# Patient Record
Sex: Male | Born: 1955 | Race: White | Hispanic: No | Smoking: Current every day smoker
Health system: Southern US, Community
[De-identification: ages and names within clinical notes are randomized; demographics above are authoritative.]

---

## 2016-09-22 ENCOUNTER — Encounter (HOSPITAL_COMMUNITY): Payer: Self-pay | Admitting: Emergency Medicine

## 2016-09-22 ENCOUNTER — Ambulatory Visit (HOSPITAL_COMMUNITY)
Admission: EM | Admit: 2016-09-22 | Discharge: 2016-09-23 | Disposition: A | Discharge status: Other Acute Inpatient Hospital | Payer: Self-pay | Attending: Orthopedic Surgery | Admitting: Orthopedic Surgery

## 2016-09-22 ENCOUNTER — Encounter (HOSPITAL_COMMUNITY)
Admission: EM | Disposition: A | Discharge status: Other Acute Inpatient Hospital | Payer: Self-pay | Source: Home / Self Care | Attending: Emergency Medicine

## 2016-09-22 ENCOUNTER — Emergency Department (HOSPITAL_COMMUNITY): Payer: Self-pay

## 2016-09-22 ENCOUNTER — Emergency Department (HOSPITAL_COMMUNITY): Payer: Self-pay | Admitting: Anesthesiology

## 2016-09-22 DIAGNOSIS — S56221A Laceration of other flexor muscle, fascia and tendon at forearm level, right arm, initial encounter: Secondary | ICD-10-CM | POA: Insufficient documentation

## 2016-09-22 DIAGNOSIS — N400 Enlarged prostate without lower urinary tract symptoms: Secondary | ICD-10-CM | POA: Insufficient documentation

## 2016-09-22 DIAGNOSIS — S41111A Laceration without foreign body of right upper arm, initial encounter: Secondary | ICD-10-CM

## 2016-09-22 DIAGNOSIS — F17210 Nicotine dependence, cigarettes, uncomplicated: Secondary | ICD-10-CM | POA: Insufficient documentation

## 2016-09-22 DIAGNOSIS — Z79899 Other long term (current) drug therapy: Secondary | ICD-10-CM | POA: Insufficient documentation

## 2016-09-22 DIAGNOSIS — I700 Atherosclerosis of aorta: Secondary | ICD-10-CM | POA: Insufficient documentation

## 2016-09-22 DIAGNOSIS — S56522A Laceration of other extensor muscle, fascia and tendon at forearm level, left arm, initial encounter: Secondary | ICD-10-CM | POA: Insufficient documentation

## 2016-09-22 DIAGNOSIS — I2510 Atherosclerotic heart disease of native coronary artery without angina pectoris: Secondary | ICD-10-CM | POA: Insufficient documentation

## 2016-09-22 DIAGNOSIS — Y92410 Unspecified street and highway as the place of occurrence of the external cause: Secondary | ICD-10-CM | POA: Insufficient documentation

## 2016-09-22 DIAGNOSIS — S51812A Laceration without foreign body of left forearm, initial encounter: Secondary | ICD-10-CM | POA: Insufficient documentation

## 2016-09-22 DIAGNOSIS — S41112A Laceration without foreign body of left upper arm, initial encounter: Secondary | ICD-10-CM

## 2016-09-22 DIAGNOSIS — S51811A Laceration without foreign body of right forearm, initial encounter: Secondary | ICD-10-CM | POA: Insufficient documentation

## 2016-09-22 DIAGNOSIS — R45851 Suicidal ideations: Secondary | ICD-10-CM

## 2016-09-22 HISTORY — PX: I & D EXTREMITY: SHX5045

## 2016-09-22 HISTORY — PX: NERVE, TENDON AND ARTERY REPAIR: SHX5695

## 2016-09-22 HISTORY — PX: FLEXOR TENDON REPAIR: SHX6501

## 2016-09-22 HISTORY — PX: REPAIR EXTENSOR TENDON: SHX5382

## 2016-09-22 LAB — I-STAT CHEM 8, ED
BUN: 14 mg/dL (ref 6–20)
CREATININE: 1 mg/dL (ref 0.61–1.24)
Calcium, Ion: 1.07 mmol/L — ABNORMAL LOW (ref 1.15–1.40)
Chloride: 106 mmol/L (ref 101–111)
GLUCOSE: 117 mg/dL — AB (ref 65–99)
HEMATOCRIT: 40 % (ref 39.0–52.0)
HEMOGLOBIN: 13.6 g/dL (ref 13.0–17.0)
POTASSIUM: 3.9 mmol/L (ref 3.5–5.1)
Sodium: 138 mmol/L (ref 135–145)
TCO2: 21 mmol/L (ref 0–100)

## 2016-09-22 LAB — CBC WITH DIFFERENTIAL/PLATELET
BASOS ABS: 0 10*3/uL (ref 0.0–0.1)
BASOS PCT: 0 %
EOS PCT: 0 %
Eosinophils Absolute: 0 10*3/uL (ref 0.0–0.7)
HEMATOCRIT: 39.2 % (ref 39.0–52.0)
HEMOGLOBIN: 13.2 g/dL (ref 13.0–17.0)
LYMPHS ABS: 1.6 10*3/uL (ref 0.7–4.0)
LYMPHS PCT: 14 %
MCH: 33.6 pg (ref 26.0–34.0)
MCHC: 33.7 g/dL (ref 30.0–36.0)
MCV: 99.7 fL (ref 78.0–100.0)
MONOS PCT: 7 %
Monocytes Absolute: 0.8 10*3/uL (ref 0.1–1.0)
NEUTROS ABS: 9 10*3/uL — AB (ref 1.7–7.7)
Neutrophils Relative %: 79 %
Platelets: 307 10*3/uL (ref 150–400)
RBC: 3.93 MIL/uL — ABNORMAL LOW (ref 4.22–5.81)
RDW: 12.6 % (ref 11.5–15.5)
WBC: 11.4 10*3/uL — ABNORMAL HIGH (ref 4.0–10.5)

## 2016-09-22 LAB — COMPREHENSIVE METABOLIC PANEL
ALBUMIN: 3.4 g/dL — AB (ref 3.5–5.0)
ALT: 20 U/L (ref 17–63)
ANION GAP: 12 (ref 5–15)
AST: 24 U/L (ref 15–41)
Alkaline Phosphatase: 75 U/L (ref 38–126)
BILIRUBIN TOTAL: 0.7 mg/dL (ref 0.3–1.2)
BUN: 13 mg/dL (ref 6–20)
CHLORIDE: 108 mmol/L (ref 101–111)
CO2: 18 mmol/L — AB (ref 22–32)
Calcium: 9 mg/dL (ref 8.9–10.3)
Creatinine, Ser: 1.16 mg/dL (ref 0.61–1.24)
GFR calc Af Amer: >60 mL/min (ref >60)
GFR calc non Af Amer: >60 mL/min (ref >60)
GLUCOSE: 121 mg/dL — AB (ref 65–99)
POTASSIUM: 3.9 mmol/L (ref 3.5–5.1)
SODIUM: 138 mmol/L (ref 135–145)
Total Protein: 6.7 g/dL (ref 6.5–8.1)

## 2016-09-22 LAB — ABO/RH: ABO/RH(D): A NEG

## 2016-09-22 LAB — ACETAMINOPHEN LEVEL

## 2016-09-22 LAB — PROTIME-INR
INR: 1.01
PROTHROMBIN TIME: 13.3 s (ref 11.4–15.2)

## 2016-09-22 LAB — TYPE AND SCREEN
ABO/RH(D): A NEG
Antibody Screen: NEGATIVE

## 2016-09-22 LAB — ETHANOL

## 2016-09-22 LAB — SALICYLATE LEVEL

## 2016-09-22 SURGERY — IRRIGATION AND DEBRIDEMENT EXTREMITY
Anesthesia: General | Site: Arm Lower

## 2016-09-22 MED ORDER — THIAMINE HCL 100 MG/ML IJ SOLN
Freq: Once | INTRAVENOUS | Status: AC
  Administered 2016-09-22: 18:00:00 via INTRAVENOUS
  Filled 2016-09-22: qty 1000

## 2016-09-22 MED ORDER — LIDOCAINE HCL (PF) 1 % IJ SOLN
INTRAMUSCULAR | Status: AC
  Filled 2016-09-22: qty 30

## 2016-09-22 MED ORDER — OXYCODONE HCL 5 MG PO TABS: 5.0000 mg | ORAL_TABLET | Freq: Once | ORAL | Status: DC | PRN

## 2016-09-22 MED ORDER — LIDOCAINE 2% (20 MG/ML) 5 ML SYRINGE
INTRAMUSCULAR | Status: AC
  Filled 2016-09-22: qty 5

## 2016-09-22 MED ORDER — VITAMIN B-1 100 MG PO TABS: 100.0000 mg | ORAL_TABLET | Freq: Every day | ORAL | Status: DC

## 2016-09-22 MED ORDER — LORAZEPAM 1 MG PO TABS: 0.0000 mg | ORAL_TABLET | Freq: Four times a day (QID) | ORAL | Status: DC

## 2016-09-22 MED ORDER — LORAZEPAM 2 MG/ML IJ SOLN
0.0000 mg | Freq: Two times a day (BID) | INTRAMUSCULAR | Status: DC
Start: 2016-09-25 — End: 2016-09-23

## 2016-09-22 MED ORDER — FENTANYL CITRATE (PF) 100 MCG/2ML IJ SOLN
25.0000 ug | INTRAMUSCULAR | Status: DC | PRN
  Administered 2016-09-23: 50 ug via INTRAVENOUS

## 2016-09-22 MED ORDER — THIAMINE HCL 100 MG/ML IJ SOLN: 100.0000 mg | Freq: Every day | INTRAMUSCULAR | Status: DC

## 2016-09-22 MED ORDER — MIDAZOLAM HCL 2 MG/2ML IJ SOLN
INTRAMUSCULAR | Status: DC | PRN
  Administered 2016-09-22: 2 mg via INTRAVENOUS

## 2016-09-22 MED ORDER — BUPIVACAINE HCL (PF) 0.25 % IJ SOLN
INTRAMUSCULAR | Status: DC | PRN
  Administered 2016-09-22: 8 mL
  Administered 2016-09-23: 10 mL

## 2016-09-22 MED ORDER — SODIUM CHLORIDE 0.9 % IR SOLN
Status: DC | PRN
  Administered 2016-09-22: 3000 mL

## 2016-09-22 MED ORDER — EPHEDRINE SULFATE 50 MG/ML IJ SOLN
INTRAMUSCULAR | Status: DC | PRN
  Administered 2016-09-22 (×2): 5 mg via INTRAVENOUS
  Administered 2016-09-22: 10 mg via INTRAVENOUS

## 2016-09-22 MED ORDER — LIDOCAINE HCL (CARDIAC) 20 MG/ML IV SOLN
INTRAVENOUS | Status: DC | PRN
  Administered 2016-09-22: 60 mg via INTRATRACHEAL

## 2016-09-22 MED ORDER — SUCCINYLCHOLINE CHLORIDE 20 MG/ML IJ SOLN
INTRAMUSCULAR | Status: DC | PRN
  Administered 2016-09-22: 80 mg via INTRAVENOUS

## 2016-09-22 MED ORDER — VITAMIN B-1 100 MG PO TABS
100.0000 mg | ORAL_TABLET | Freq: Every day | ORAL | Status: DC
  Administered 2016-09-23: 100 mg via ORAL
  Filled 2016-09-22: qty 1

## 2016-09-22 MED ORDER — FENTANYL CITRATE (PF) 250 MCG/5ML IJ SOLN
INTRAMUSCULAR | Status: AC
  Filled 2016-09-22: qty 5

## 2016-09-22 MED ORDER — PROPOFOL 10 MG/ML IV BOLUS
INTRAVENOUS | Status: DC | PRN
  Administered 2016-09-22: 200 mg via INTRAVENOUS

## 2016-09-22 MED ORDER — MIDAZOLAM HCL 2 MG/2ML IJ SOLN
INTRAMUSCULAR | Status: AC
  Filled 2016-09-22: qty 2

## 2016-09-22 MED ORDER — LORAZEPAM 1 MG PO TABS: 0.0000 mg | ORAL_TABLET | Freq: Two times a day (BID) | ORAL | Status: DC

## 2016-09-22 MED ORDER — LORAZEPAM 2 MG/ML IJ SOLN: 0.0000 mg | Freq: Four times a day (QID) | INTRAMUSCULAR | Status: DC

## 2016-09-22 MED ORDER — FENTANYL CITRATE (PF) 250 MCG/5ML IJ SOLN
INTRAMUSCULAR | Status: DC | PRN
  Administered 2016-09-22 (×3): 50 ug via INTRAVENOUS
  Administered 2016-09-22: 150 ug via INTRAVENOUS
  Administered 2016-09-22 – 2016-09-23 (×2): 50 ug via INTRAVENOUS

## 2016-09-22 MED ORDER — LACTATED RINGERS IV SOLN
INTRAVENOUS | Status: DC | PRN
  Administered 2016-09-22 – 2016-09-23 (×3): via INTRAVENOUS

## 2016-09-22 MED ORDER — ALBUMIN HUMAN 5 % IV SOLN
INTRAVENOUS | Status: DC | PRN
  Administered 2016-09-22 (×2): via INTRAVENOUS

## 2016-09-22 MED ORDER — BUPIVACAINE HCL (PF) 0.25 % IJ SOLN
INTRAMUSCULAR | Status: AC
  Filled 2016-09-22: qty 30

## 2016-09-22 MED ORDER — OXYCODONE HCL 5 MG/5ML PO SOLN: 5.0000 mg | Freq: Once | ORAL | Status: DC | PRN

## 2016-09-22 MED ORDER — 0.9 % SODIUM CHLORIDE (POUR BTL) OPTIME
TOPICAL | Status: DC | PRN
  Administered 2016-09-22: 1000 mL

## 2016-09-22 MED ORDER — ROCURONIUM BROMIDE 10 MG/ML (PF) SYRINGE
PREFILLED_SYRINGE | INTRAVENOUS | Status: AC
  Filled 2016-09-22: qty 5

## 2016-09-22 MED ORDER — PROPOFOL 10 MG/ML IV BOLUS
INTRAVENOUS | Status: AC
  Filled 2016-09-22: qty 20

## 2016-09-22 MED ORDER — SODIUM CHLORIDE 0.9 % IV BOLUS (SEPSIS)
2000.0000 mL | Freq: Once | INTRAVENOUS | Status: AC
  Administered 2016-09-22: 2000 mL via INTRAVENOUS

## 2016-09-22 MED ORDER — LORAZEPAM 2 MG/ML IJ SOLN
1.0000 mg | Freq: Once | INTRAMUSCULAR | Status: AC
  Administered 2016-09-22: 1 mg via INTRAVENOUS
  Filled 2016-09-22: qty 1

## 2016-09-22 MED ORDER — CEFAZOLIN SODIUM-DEXTROSE 2-4 GM/100ML-% IV SOLN
2.0000 g | Freq: Once | INTRAVENOUS | Status: AC
  Administered 2016-09-22: 2 g via INTRAVENOUS
  Filled 2016-09-22: qty 100

## 2016-09-22 MED ORDER — IOPAMIDOL (ISOVUE-300) INJECTION 61%
INTRAVENOUS | Status: AC
  Administered 2016-09-22: 100 mL
  Filled 2016-09-22: qty 100

## 2016-09-22 MED ORDER — SUCCINYLCHOLINE CHLORIDE 200 MG/10ML IV SOSY
PREFILLED_SYRINGE | INTRAVENOUS | Status: AC
  Filled 2016-09-22: qty 20

## 2016-09-22 MED ORDER — HEPARIN SODIUM (PORCINE) 1000 UNIT/ML IJ SOLN
INTRAMUSCULAR | Status: AC
  Filled 2016-09-22: qty 1

## 2016-09-22 MED ORDER — PHENYLEPHRINE HCL 10 MG/ML IJ SOLN
INTRAMUSCULAR | Status: DC | PRN
  Administered 2016-09-22 (×3): 80 ug via INTRAVENOUS
  Administered 2016-09-22: 120 ug via INTRAVENOUS
  Administered 2016-09-22: 40 ug via INTRAVENOUS

## 2016-09-22 SURGICAL SUPPLY — 63 items
BAG DECANTER FOR FLEXI CONT (MISCELLANEOUS) IMPLANT
BANDAGE ACE 3X5.8 VEL STRL LF (GAUZE/BANDAGES/DRESSINGS) ×8 IMPLANT
BLADE MINI RND TIP GREEN BEAV (BLADE) IMPLANT
BLADE SURG 15 STRL LF DISP TIS (BLADE) ×4 IMPLANT
BLADE SURG 15 STRL SS (BLADE) ×4
BNDG ESMARK 4X9 LF (GAUZE/BANDAGES/DRESSINGS) IMPLANT
BNDG GAUZE ELAST 4 BULKY (GAUZE/BANDAGES/DRESSINGS) ×8 IMPLANT
CANISTER SUCT 3000ML (MISCELLANEOUS) ×4 IMPLANT
CHLORAPREP W/TINT 26ML (MISCELLANEOUS) ×4 IMPLANT
CORDS BIPOLAR (ELECTRODE) ×4 IMPLANT
COVER MAYO STAND STRL (DRAPES) ×4 IMPLANT
COVER SURGICAL LIGHT HANDLE (MISCELLANEOUS) ×4 IMPLANT
CUFF TOURNIQUET SINGLE 18IN (TOURNIQUET CUFF) IMPLANT
DECANTER SPIKE VIAL GLASS SM (MISCELLANEOUS) ×4 IMPLANT
DRAPE EXTREMITY T 121X128X90 (DRAPE) ×8 IMPLANT
DRAPE SURG 17X23 STRL (DRAPES) ×4 IMPLANT
DRAPE U-SHAPE 76X120 STRL (DRAPES) ×8 IMPLANT
GAUZE SPONGE 4X4 12PLY STRL (GAUZE/BANDAGES/DRESSINGS) ×8 IMPLANT
GAUZE XEROFORM 1X8 LF (GAUZE/BANDAGES/DRESSINGS) ×4 IMPLANT
GAUZE XEROFORM 5X9 LF (GAUZE/BANDAGES/DRESSINGS) ×8 IMPLANT
GLOVE BIO SURGEON STRL SZ7.5 (GLOVE) ×4 IMPLANT
GLOVE BIOGEL PI IND STRL 8 (GLOVE) ×2 IMPLANT
GLOVE BIOGEL PI INDICATOR 8 (GLOVE) ×2
GOWN STRL REUS W/ TWL LRG LVL3 (GOWN DISPOSABLE) ×2 IMPLANT
GOWN STRL REUS W/ TWL XL LVL3 (GOWN DISPOSABLE) ×2 IMPLANT
GOWN STRL REUS W/TWL LRG LVL3 (GOWN DISPOSABLE) ×2
GOWN STRL REUS W/TWL XL LVL3 (GOWN DISPOSABLE) ×2
KIT BASIN OR (CUSTOM PROCEDURE TRAY) ×4 IMPLANT
LOOP VESSEL MAXI BLUE (MISCELLANEOUS) IMPLANT
NDL SAFETY ECLIPSE 18X1.5 (NEEDLE) IMPLANT
NEEDLE HYPO 18GX1.5 SHARP (NEEDLE)
NEEDLE HYPO 25X1 1.5 SAFETY (NEEDLE) IMPLANT
NS IRRIG 1000ML POUR BTL (IV SOLUTION) ×4 IMPLANT
PACK ORTHO EXTREMITY (CUSTOM PROCEDURE TRAY) ×4 IMPLANT
PAD CAST 3X4 CTTN HI CHSV (CAST SUPPLIES) ×2 IMPLANT
PAD CAST 4YDX4 CTTN HI CHSV (CAST SUPPLIES) IMPLANT
PADDING CAST ABS 4INX4YD NS (CAST SUPPLIES) ×2
PADDING CAST ABS COTTON 4X4 ST (CAST SUPPLIES) ×2 IMPLANT
PADDING CAST COTTON 3X4 STRL (CAST SUPPLIES) ×2
PADDING CAST COTTON 4X4 STRL (CAST SUPPLIES)
SPEAR EYE SURG WECK-CEL (MISCELLANEOUS) ×4 IMPLANT
SPLINT PLASTER CAST XFAST 3X15 (CAST SUPPLIES) IMPLANT
SPLINT PLASTER XTRA FASTSET 3X (CAST SUPPLIES)
STOCKINETTE 4X48 STRL (DRAPES) ×8 IMPLANT
SUT ETHIBOND 3-0 V-5 (SUTURE) IMPLANT
SUT ETHILON 3 0 PS 1 (SUTURE) ×44 IMPLANT
SUT ETHILON 4 0 PS 2 18 (SUTURE) ×4 IMPLANT
SUT ETHILON 9 0 BV130 4 (SUTURE) ×8 IMPLANT
SUT FIBERWIRE 4-0 18 TAPR NDL (SUTURE) ×4
SUT MERSILENE 6 0 P 1 (SUTURE) IMPLANT
SUT NYLON 9 0 VRM6 (SUTURE) IMPLANT
SUT PROLENE 6 0 P 1 18 (SUTURE) IMPLANT
SUT SILK 4 0 PS 2 (SUTURE) IMPLANT
SUT VIC AB 2-0 SH 27 (SUTURE) ×2
SUT VIC AB 2-0 SH 27XBRD (SUTURE) ×2 IMPLANT
SUT VICRYL 4-0 PS2 18IN ABS (SUTURE) IMPLANT
SUTURE FIBERWR 4-0 18 TAPR NDL (SUTURE) ×2 IMPLANT
SYR BULB 3OZ (MISCELLANEOUS) ×4 IMPLANT
SYR CONTROL 10ML LL (SYRINGE) IMPLANT
TOWEL OR 17X24 6PK STRL BLUE (TOWEL DISPOSABLE) ×8 IMPLANT
TUBE CONNECTING 12'X1/4 (SUCTIONS) ×1
TUBE CONNECTING 12X1/4 (SUCTIONS) ×3 IMPLANT
UNDERPAD 30X30 (UNDERPADS AND DIAPERS) ×4 IMPLANT
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
## 2016-09-22 NOTE — ED Notes (Signed)
Pt. signed consent form for his bilateral arms surgery by Dr. Merlyn LotKuzma.
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
## 2016-09-22 NOTE — Anesthesia Procedure Notes (Signed)
Procedure Name: Intubation Date/Time: 09/22/2016 9:56 PM Performed by: Molli HazardGORDON, Keirstin Musil M Pre-anesthesia Checklist: Patient identified, Emergency Drugs available, Suction available and Patient being monitored Patient Re-evaluated:Patient Re-evaluated prior to induction Oxygen Delivery Method: Circle system utilized Preoxygenation: Pre-oxygenation with 100% oxygen Induction Type: IV induction Laryngoscope Size: Miller and 2 Grade View: Grade I Tube type: Oral Tube size: 7.5 mm Number of attempts: 1 Airway Equipment and Method: Stylet Placement Confirmation: ETT inserted through vocal cords under direct vision,  positive ETCO2 and breath sounds checked- equal and bilateral Secured at: 24 cm Tube secured with: Tape Dental Injury: Teeth and Oropharynx as per pre-operative assessment

## 2016-09-22 NOTE — Anesthesia Preprocedure Evaluation (Addendum)
Anesthesia Evaluation  Patient identified by MRN, date of birth, ID band Patient awake    Reviewed: Allergy & Precautions, NPO status , Patient's Chart, lab work & pertinent test results  History of Anesthesia Complications Negative for: history of anesthetic complications  Airway Mallampati: I  TM Distance: >3 FB Neck ROM: Full    Dental  (+) Dental Advisory Given, Teeth Intact,    Pulmonary Current Smoker,  1.5-2 packs/day   breath sounds clear to auscultation       Cardiovascular negative cardio ROS   Rhythm:Regular     Neuro/Psych negative neurological ROS  negative psych ROS   GI/Hepatic negative GI ROS, Neg liver ROS,   Endo/Other  negative endocrine ROS  Renal/GU negative Renal ROS     Musculoskeletal   Abdominal   Peds  Hematology negative hematology ROS (+)   Anesthesia Other Findings   Reproductive/Obstetrics                            Anesthesia Physical Anesthesia Plan  ASA: II  Anesthesia Plan: General   Post-op Pain Management:    Induction: Intravenous  PONV Risk Score and Plan: 2 and Ondansetron and Dexamethasone  Airway Management Planned: Oral ETT  Additional Equipment: None  Intra-op Plan:   Post-operative Plan: Extubation in OR  Informed Consent:   Plan Discussed with: Anesthesiologist, Surgeon and CRNA  Anesthesia Plan Comments:        Anesthesia Quick Evaluation

## 2016-09-22 NOTE — ED Notes (Signed)
Pt transported to xray 

## 2016-09-23 ENCOUNTER — Inpatient Hospital Stay (HOSPITAL_COMMUNITY)
Admission: AD | Admit: 2016-09-23 | Discharge: 2016-10-05 | DRG: 881 | Disposition: A | Discharge status: Home / Self Care | Payer: Federal, State, Local not specified - Other | Attending: Psychiatry | Admitting: Psychiatry

## 2016-09-23 ENCOUNTER — Encounter (HOSPITAL_COMMUNITY): Payer: Self-pay | Admitting: Orthopedic Surgery

## 2016-09-23 DIAGNOSIS — X788XXD Intentional self-harm by other sharp object, subsequent encounter: Secondary | ICD-10-CM | POA: Diagnosis present

## 2016-09-23 DIAGNOSIS — F322 Major depressive disorder, single episode, severe without psychotic features: Secondary | ICD-10-CM | POA: Diagnosis not present

## 2016-09-23 DIAGNOSIS — F419 Anxiety disorder, unspecified: Secondary | ICD-10-CM | POA: Diagnosis present

## 2016-09-23 DIAGNOSIS — F1910 Other psychoactive substance abuse, uncomplicated: Secondary | ICD-10-CM | POA: Diagnosis not present

## 2016-09-23 DIAGNOSIS — Z560 Unemployment, unspecified: Secondary | ICD-10-CM | POA: Diagnosis not present

## 2016-09-23 DIAGNOSIS — F17210 Nicotine dependence, cigarettes, uncomplicated: Secondary | ICD-10-CM | POA: Diagnosis present

## 2016-09-23 DIAGNOSIS — S56222D Laceration of other flexor muscle, fascia and tendon at forearm level, left arm, subsequent encounter: Secondary | ICD-10-CM | POA: Diagnosis not present

## 2016-09-23 DIAGNOSIS — F329 Major depressive disorder, single episode, unspecified: Principal | ICD-10-CM | POA: Diagnosis present

## 2016-09-23 DIAGNOSIS — S56221D Laceration of other flexor muscle, fascia and tendon at forearm level, right arm, subsequent encounter: Secondary | ICD-10-CM | POA: Diagnosis not present

## 2016-09-23 DIAGNOSIS — F1010 Alcohol abuse, uncomplicated: Secondary | ICD-10-CM

## 2016-09-23 DIAGNOSIS — G4700 Insomnia, unspecified: Secondary | ICD-10-CM | POA: Diagnosis present

## 2016-09-23 DIAGNOSIS — K5900 Constipation, unspecified: Secondary | ICD-10-CM | POA: Diagnosis not present

## 2016-09-23 LAB — RAPID URINE DRUG SCREEN, HOSP PERFORMED
Amphetamines: NOT DETECTED
Barbiturates: NOT DETECTED
Benzodiazepines: POSITIVE — AB
Cocaine: NOT DETECTED
OPIATES: NOT DETECTED
Tetrahydrocannabinol: NOT DETECTED

## 2016-09-23 LAB — I-STAT CG4 LACTIC ACID, ED: Lactic Acid, Venous: 1.75 mmol/L (ref 0.5–1.9)

## 2016-09-23 MED ORDER — TRAZODONE HCL 50 MG PO TABS
50.0000 mg | ORAL_TABLET | Freq: Every evening | ORAL | Status: DC | PRN
  Administered 2016-09-26 – 2016-10-04 (×9): 50 mg via ORAL
  Filled 2016-09-23 (×2): qty 1
  Filled 2016-09-23: qty 2
  Filled 2016-09-23: qty 1
  Filled 2016-09-23: qty 7
  Filled 2016-09-23 (×3): qty 1
  Filled 2016-09-23: qty 14

## 2016-09-23 MED ORDER — SULFAMETHOXAZOLE-TRIMETHOPRIM 800-160 MG PO TABS: 1.0000 | ORAL_TABLET | Freq: Two times a day (BID) | ORAL | 0 refills | Status: DC

## 2016-09-23 MED ORDER — MAGNESIUM HYDROXIDE 400 MG/5ML PO SUSP
30.0000 mL | Freq: Every day | ORAL | Status: DC | PRN
  Administered 2016-09-25: 30 mL via ORAL
  Filled 2016-09-23: qty 30

## 2016-09-23 MED ORDER — HYDROCODONE-ACETAMINOPHEN 5-325 MG PO TABS: ORAL_TABLET | ORAL | 0 refills | Status: DC

## 2016-09-23 MED ORDER — CHLORDIAZEPOXIDE HCL 25 MG PO CAPS
25.0000 mg | ORAL_CAPSULE | Freq: Every day | ORAL | Status: AC
  Administered 2016-09-28: 25 mg via ORAL
  Filled 2016-09-23: qty 1

## 2016-09-23 MED ORDER — ADULT MULTIVITAMIN W/MINERALS CH
1.0000 | ORAL_TABLET | Freq: Every day | ORAL | Status: DC
  Administered 2016-09-24 – 2016-10-05 (×12): 1 via ORAL
  Filled 2016-09-23 (×13): qty 1

## 2016-09-23 MED ORDER — LOPERAMIDE HCL 2 MG PO CAPS: 2.0000 mg | ORAL_CAPSULE | ORAL | Status: AC | PRN

## 2016-09-23 MED ORDER — HYDROXYZINE HCL 25 MG PO TABS
25.0000 mg | ORAL_TABLET | Freq: Four times a day (QID) | ORAL | Status: AC | PRN
  Administered 2016-09-26: 25 mg via ORAL
  Filled 2016-09-23: qty 1

## 2016-09-23 MED ORDER — HYDROXYZINE HCL 25 MG PO TABS: 25.0000 mg | ORAL_TABLET | Freq: Three times a day (TID) | ORAL | Status: DC | PRN

## 2016-09-23 MED ORDER — ONDANSETRON 4 MG PO TBDP: 4.0000 mg | ORAL_TABLET | Freq: Four times a day (QID) | ORAL | Status: AC | PRN

## 2016-09-23 MED ORDER — VITAMIN B-1 100 MG PO TABS
100.0000 mg | ORAL_TABLET | Freq: Every day | ORAL | Status: DC
  Administered 2016-09-24 – 2016-10-05 (×12): 100 mg via ORAL
  Filled 2016-09-23 (×13): qty 1

## 2016-09-23 MED ORDER — CHLORDIAZEPOXIDE HCL 25 MG PO CAPS
25.0000 mg | ORAL_CAPSULE | Freq: Three times a day (TID) | ORAL | Status: AC
  Administered 2016-09-25 – 2016-09-26 (×3): 25 mg via ORAL
  Filled 2016-09-23 (×3): qty 1

## 2016-09-23 MED ORDER — ONDANSETRON HCL 4 MG/2ML IJ SOLN
INTRAMUSCULAR | Status: DC | PRN
  Administered 2016-09-23: 4 mg via INTRAVENOUS

## 2016-09-23 MED ORDER — DEXAMETHASONE SODIUM PHOSPHATE 10 MG/ML IJ SOLN
INTRAMUSCULAR | Status: DC | PRN
  Administered 2016-09-23: 10 mg via INTRAVENOUS

## 2016-09-23 MED ORDER — ACETAMINOPHEN 325 MG PO TABS
650.0000 mg | ORAL_TABLET | Freq: Four times a day (QID) | ORAL | Status: DC | PRN
  Administered 2016-09-24: 650 mg via ORAL
  Filled 2016-09-23: qty 2

## 2016-09-23 MED ORDER — CEFAZOLIN SODIUM-DEXTROSE 1-4 GM/50ML-% IV SOLN
1.0000 g | Freq: Three times a day (TID) | INTRAVENOUS | Status: DC
  Administered 2016-09-23 (×2): 1 g via INTRAVENOUS
  Filled 2016-09-23 (×2): qty 50

## 2016-09-23 MED ORDER — CHLORDIAZEPOXIDE HCL 25 MG PO CAPS
25.0000 mg | ORAL_CAPSULE | ORAL | Status: AC
  Administered 2016-09-26 – 2016-09-27 (×2): 25 mg via ORAL
  Filled 2016-09-23 (×2): qty 1

## 2016-09-23 MED ORDER — ALUM & MAG HYDROXIDE-SIMETH 200-200-20 MG/5ML PO SUSP: 30.0000 mL | ORAL | Status: DC | PRN

## 2016-09-23 MED ORDER — CHLORDIAZEPOXIDE HCL 25 MG PO CAPS
25.0000 mg | ORAL_CAPSULE | Freq: Four times a day (QID) | ORAL | Status: AC
  Administered 2016-09-23 – 2016-09-25 (×6): 25 mg via ORAL
  Filled 2016-09-23 (×6): qty 1

## 2016-09-23 MED ORDER — CHLORDIAZEPOXIDE HCL 25 MG PO CAPS: 25.0000 mg | ORAL_CAPSULE | Freq: Four times a day (QID) | ORAL | Status: AC | PRN

## 2016-09-23 MED ORDER — FENTANYL CITRATE (PF) 100 MCG/2ML IJ SOLN
INTRAMUSCULAR | Status: AC
  Filled 2016-09-23: qty 2

## 2016-09-23 MED ORDER — NICOTINE 21 MG/24HR TD PT24
21.0000 mg | MEDICATED_PATCH | Freq: Every day | TRANSDERMAL | Status: DC
  Administered 2016-09-24 – 2016-10-05 (×11): 21 mg via TRANSDERMAL
  Filled 2016-09-23: qty 1
  Filled 2016-09-23: qty 14
  Filled 2016-09-23: qty 1
  Filled 2016-09-23: qty 14
  Filled 2016-09-23: qty 1
  Filled 2016-09-23 (×3): qty 14
  Filled 2016-09-23: qty 1
  Filled 2016-09-23: qty 14
  Filled 2016-09-23 (×4): qty 1
  Filled 2016-09-23: qty 14

## 2016-09-23 NOTE — Transfer of Care (Signed)
Immediate Anesthesia Transfer of Care Note  Patient: Andrew Maldonado  Procedure(s) Performed: Procedure(s): BILATERAL ARM EXPLORATION POSSIBLE NERVE, TENDON AND ARTERY REPAIR AS NEEDED (N/A) FLEXOR TENDON REPAIR (Bilateral) REPAIR EXTENSOR TENDON (Bilateral) IRRIGATION AND DEBRIDEMENT EXTREMITY (Bilateral)  Patient Location: PACU  Anesthesia Type:General  Level of Consciousness: awake, alert  and oriented  Airway & Oxygen Therapy: Patient connected to nasal cannula oxygen  Post-op Assessment: Report given to RN and Post -op Vital signs reviewed and stable  Post vital signs: Reviewed and stable  Last Vitals:  Vitals:   09/22/16 2100 09/22/16 2115  BP: (!) 135/107 (!) 141/87  Pulse: 95 92  Resp: (!) 22 (!) 26  Temp:      Last Pain:  Vitals:   09/22/16 1725  TempSrc: Oral         Complications: No apparent anesthesia complications
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
## 2016-09-23 NOTE — Progress Notes (Signed)
Patient attend AA group meeting.  

## 2016-09-23 NOTE — Anesthesia Postprocedure Evaluation (Signed)
Anesthesia Post Note  Patient: Andrew Maldonado  Procedure(s) Performed: Procedure(s) (LRB): BILATERAL ARM EXPLORATION POSSIBLE NERVE, TENDON AND ARTERY REPAIR AS NEEDED (N/A) FLEXOR TENDON REPAIR (Bilateral) REPAIR EXTENSOR TENDON (Bilateral) IRRIGATION AND DEBRIDEMENT EXTREMITY (Bilateral)     Patient location during evaluation: PACU Anesthesia Type: General Level of consciousness: awake and patient cooperative Pain management: pain level controlled Vital Signs Assessment: post-procedure vital signs reviewed and stable Respiratory status: spontaneous breathing, nonlabored ventilation, respiratory function stable and patient connected to nasal cannula oxygen Cardiovascular status: blood pressure returned to baseline and stable Postop Assessment: no signs of nausea or vomiting Anesthetic complications: no    Last Vitals:  Vitals:   09/23/16 0145 09/23/16 0158  BP: 136/85 136/88  Pulse: 100 99  Resp: 14 20  Temp:  (!) 36.4 C    Last Pain:  Vitals:   09/23/16 0145  TempSrc:   PainSc: Asleep                 Litzi Binning

## 2016-09-23 NOTE — ED Notes (Signed)
Pt recommended for inpt and awaiting a bed.
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
## 2016-09-23 NOTE — Progress Notes (Signed)
Per Andrew Maldonado , [facility], [facility] ; Accepting provider is  Andrew ConnJason Berry, NP; Attending provider is Dr. Jama Maldonado.    Patient can arrive at 4:00pm. Number for report is [phone].   Andrew KotykMelanie Fowler, RN notified.     Andrew Maldonado MSW, LCSWA CSW Disposition [phone]

## 2016-09-23 NOTE — Op Note (Signed)
[id number] 
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
## 2016-09-23 NOTE — ED Notes (Signed)
[place] [facility] offers here [facility]. Pt has signed for his belongings, one bag. Pt denies any items being missing. Denies items being locked with security. Pt A&Ox4, ambulatory with independent steady gait, NAD

## 2016-09-23 NOTE — Progress Notes (Signed)
CSW requested that patients IVC paperwork be faxed to [phone] to begin referral process for possible placement.   Chelsea Felts, RN notified and has agreed to fax documentation.   Baldo DaubJolan Jazzie Trampe MSW, LCSWA CSW Disposition [phone]

## 2016-09-23 NOTE — Discharge Instructions (Signed)

## 2016-09-23 NOTE — ED Notes (Signed)
This RN has called [employer] [facility] to [facility] [employer].

## 2016-09-23 NOTE — ED Notes (Signed)
Consulted Dr. Patria Maneampos regarding pt's IV antibiotics dose, nurse's concerns for pt being medically cleared. Dr. Patria Maneampos agreeable to see patient, may change orders to orals. See his note.

## 2016-09-23 NOTE — Progress Notes (Signed)
DAR note: Pt in dayroom upon initial approach.  Presents with fixed smile; minimizes reason for admission.  Denies SI/HI, hallucinations, and pain.  Reports to writer that he was not drinking alcohol prior to admission, which contradicts what he reported to admission nurse.  Mild tremor observable.  On-site provider notified that pt reported he was drinking daily to admission nurse, CIWA is 3, pt is unreliable historian.  Pt placed on Librium protocol.  Medication administered per order.  He is safe on the unit; pt verbally contracts for safety.  Will continue to monitor and assess.

## 2016-09-23 NOTE — Tx Team (Signed)
Initial Treatment Plan 09/23/2016 5:29 PM FEENYat Labombard ZOX:[id number]    PATIENT STRESSORS: Financial difficulties Substance abuse   PATIENT STRENGTHS: Capable of independent living General fund of knowledge Physical Health   PATIENT IDENTIFIED PROBLEMS: Depression   Suicidal ideation/attempt  "Be a better person"                 DISCHARGE CRITERIA:  Improved stabilization in mood, thinking, and/or behavior Verbal commitment to aftercare and medication compliance Withdrawal symptoms are absent or subacute and managed without 24-hour nursing intervention  PRELIMINARY DISCHARGE PLAN: Outpatient therapy Medication management  PATIENT/FAMILY INVOLVEMENT: This treatment plan has been presented to and reviewed with the patient, BERNARDSat Stradling.  The patient and family have been given the opportunity to ask questions and make suggestions.  Levin BaconHeather V Marchello Rothgeb, RN 09/23/2016, 5:29 PM
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
## 2016-09-23 NOTE — ED Notes (Signed)
At 1208, pt had removed his IV from his right foot.

## 2016-09-24 DIAGNOSIS — S41119A Laceration without foreign body of unspecified upper arm, initial encounter: Secondary | ICD-10-CM

## 2016-09-24 DIAGNOSIS — S1191XA Laceration without foreign body of unspecified part of neck, initial encounter: Secondary | ICD-10-CM

## 2016-09-24 DIAGNOSIS — F1390 Sedative, hypnotic, or anxiolytic use, unspecified, uncomplicated: Secondary | ICD-10-CM

## 2016-09-24 DIAGNOSIS — Z560 Unemployment, unspecified: Secondary | ICD-10-CM

## 2016-09-24 DIAGNOSIS — S31119A Laceration without foreign body of abdominal wall, unspecified quadrant without penetration into peritoneal cavity, initial encounter: Secondary | ICD-10-CM

## 2016-09-24 DIAGNOSIS — Z599 Problem related to housing and economic circumstances, unspecified: Secondary | ICD-10-CM

## 2016-09-24 DIAGNOSIS — G4700 Insomnia, unspecified: Secondary | ICD-10-CM

## 2016-09-24 MED ORDER — SULFAMETHOXAZOLE-TRIMETHOPRIM 800-160 MG PO TABS: 1.0000 | ORAL_TABLET | Freq: Two times a day (BID) | ORAL | Status: DC

## 2016-09-24 MED ORDER — SULFAMETHOXAZOLE-TRIMETHOPRIM 800-160 MG PO TABS
1.0000 | ORAL_TABLET | Freq: Two times a day (BID) | ORAL | Status: AC
  Administered 2016-09-24 – 2016-09-30 (×14): 1 via ORAL
  Filled 2016-09-24 (×13): qty 1
  Filled 2016-09-24 (×3): qty 2
  Filled 2016-09-24 (×2): qty 1

## 2016-09-24 MED ORDER — IBUPROFEN 600 MG PO TABS
600.0000 mg | ORAL_TABLET | Freq: Four times a day (QID) | ORAL | Status: DC | PRN
  Administered 2016-09-24 – 2016-09-25 (×2): 600 mg via ORAL
  Filled 2016-09-24 (×2): qty 1
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
## 2016-09-24 NOTE — Progress Notes (Signed)
Patient did attend the evening speaker AA meeting.  
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
## 2016-09-24 NOTE — [facility] Group Notes (Signed)
  [facility] LCSW Group Therapy Note  09/24/2016  and  10:00 AM  Type of Therapy and Topic:  Group Therapy: Avoiding Self-Sabotaging and Enabling Behaviors  Participation Level:  Minimal  Participation Quality:  Attentive  Affect:  Flat  Cognitive:  Alert and Oriented  Insight:  None shared  Engagement in Therapy:  None noted   Therapeutic models used: Cognitive Behavioral Therapy,  Person-Centered Therapy and Motivational Interviewing  Modes of Intervention:  Discussion, Exploration, Orientation, Rapport Building, Socialization and Support   Summary of Progress/Problems:  The main focus of today's process group was for the patient to identify ways in which they have in the past sabotaged their own recovery. Motivational Interviewing was utilized to identify motivation they may have for wanting to change. The Stages of Change were explained using a handout, and patients identified where they currently are with regard to stages of change. Patient shared noting during group yet appeared attentive as evidenced by his eye contact. Patient stated he is in pre contemplation stage as "not aware of any changes I need."   Carney Bernatherine C Allecia Bells, LCSW

## 2016-09-24 NOTE — [facility] Group Notes (Addendum)
The Power of Vulnerability  Date:  09/24/2016  Time:  1300  Type of Therapy: " The  Power in [employer] " /  The group focuses on watching this  20 minute You Tube video and discussing their  Feelings in realtionship to the video.  oParticipation Level: intact   Participation Quality: fair   Affect: fiar   Cognitive:  active  Insight:limited     Engagement in Group:  Engaged   Modes of Intervention:  discussion  Summary of Progress/Problems:  Andrew Maldonado, Andrew Maldonado 09/24/2016, 4:03 PM

## 2016-09-24 NOTE — [facility] Counselor (Signed)
Unsuccessful attempt to meet with patient to complete PSA patient is in RN group. CSW to try again after 2:30 PM  Carney Bernatherine C Rance Smithson, LCSW
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
## 2016-09-25 NOTE — Progress Notes (Signed)
[facility]  09/25/2016 12:03 PM Andrew Maldonado  MRN:  [id number] Subjective:  "Complaint constipation and also reportedly received cocktail medication to relieve constipation but still did not work yet"  Objective: Patient seen by this M.D., chart reviewed, case discussed with the treatment team. Patient has been doing fine without significant behavioral or emotional difficulties, he is awake, alert, oriented to time place person and situation. Patient minimizes his symptoms of depression and anxiety and also suicidal and homicidal ideation. Patient has no auditory/visual hallucinations, delusions or paranoia. Patient reportedly have disturbed sleep but good appetite. Patient has been thinking about pulse to thoughts about his family especially kids, grandkids, sister, mom. Patient reportedly has a plans to contact his daughter today. Patient has been compliant with medication without adverse effects. Patient is actively participating in milieu therapy and group counseling sessions. Patient contracts for safety while in the hospital.  Principal Problem: <principal problem not specified> Diagnosis:   Patient Active Problem List   Diagnosis Date Noted  . MDD (major depressive disorder) [F32.9] 09/23/2016   Total Time spent with patient: 30 minutes  Past Psychiatric History: Patient has denied history of acute psychiatric hospitalization or outpatient treatment. Patient also denied alcohol and drug abuse treatment.  Past Medical History: History reviewed. No pertinent past medical history.  Past Surgical History:  Procedure Laterality Date  . FLEXOR TENDON REPAIR Bilateral 09/22/2016   Procedure: FLEXOR TENDON REPAIR;  Surgeon: Betha LoaKuzma, Kevin, MD;  [facility];  Service: Orthopedics;  Laterality: Bilateral;  . I&D EXTREMITY Bilateral 09/22/2016   Procedure: IRRIGATION AND DEBRIDEMENT EXTREMITY;  Surgeon: Betha LoaKuzma, Kevin, MD;  Location: [facility] OR;  Service: Orthopedics;  Laterality: Bilateral;  .  NERVE, TENDON AND ARTERY REPAIR N/A 09/22/2016   Procedure: BILATERAL ARM EXPLORATION POSSIBLE NERVE, TENDON AND ARTERY REPAIR AS NEEDED;  Surgeon: Betha LoaKuzma, Kevin, MD;  Location: [facility] OR;  Service: Orthopedics;  Laterality: N/A;  . REPAIR EXTENSOR TENDON Bilateral 09/22/2016   Procedure: REPAIR EXTENSOR TENDON;  Surgeon: Betha LoaKuzma, Kevin, MD;  Location: [facility] OR;  Service: Orthopedics;  Laterality: Bilateral;   Family History: History reviewed. No pertinent family history. Family Psychiatric  History: Patient denied family history of mental illness and substance abuse. Social History:  History  Alcohol Use  . 2.4 oz/week  . 4 Cans of beer per week     History  Drug Use No    Social History   Social History  . Marital status: Divorced    Spouse name: N/A  . Number of children: N/A  . Years of education: N/A   Social History Main Topics  . Smoking status: Current Every Day Smoker  . Smokeless tobacco: Never Used  . Alcohol use 2.4 oz/week    4 Cans of beer per week  . Drug use: No  . Sexual activity: Not Currently   Other Topics Concern  . None   Social History Narrative  . None   Additional Social History:                         Sleep: Poor  Appetite:  Good  Current Medications: Current Facility-Administered Medications  Medication Dose Route Frequency Provider Last Rate Last Dose  . acetaminophen (TYLENOL) tablet 650 mg  650 mg Oral Q6H PRN Okonkwo, Justina A, NP   650 mg at 09/24/16 2108  . alum & mag hydroxide-simeth (MAALOX/MYLANTA) 200-200-20 MG/5ML suspension 30 mL  30 mL Oral Q4H PRN Okonkwo, Justina A, NP      .  chlordiazePOXIDE (LIBRIUM) capsule 25 mg  25 mg Oral Q6H PRN Nira ConnBerry, Jason A, NP      . chlordiazePOXIDE (LIBRIUM) capsule 25 mg  25 mg Oral TID Jackelyn PolingBerry, Jason A, NP       Followed by  . [START ON 09/26/2016] chlordiazePOXIDE (LIBRIUM) capsule 25 mg  25 mg Oral BH-qamhs Jackelyn PolingBerry, Jason A, NP       Followed by  . [START ON 09/28/2016] chlordiazePOXIDE  (LIBRIUM) capsule 25 mg  25 mg Oral Daily Nira ConnBerry, Jason A, NP      . hydrOXYzine (ATARAX/VISTARIL) tablet 25 mg  25 mg Oral Q6H PRN Nira ConnBerry, Jason A, NP      . ibuprofen (ADVIL,MOTRIN) tablet 600 mg  600 mg Oral Q6H PRN Denzil Magnusonhomas, Lashunda, NP   600 mg at 09/24/16 1301  . loperamide (IMODIUM) capsule 2-4 mg  2-4 mg Oral PRN Nira ConnBerry, Jason A, NP      . magnesium hydroxide (MILK OF MAGNESIA) suspension 30 mL  30 mL Oral Daily PRN Okonkwo, Justina A, NP   30 mL at 09/25/16 0831  . multivitamin with minerals tablet 1 tablet  1 tablet Oral Daily Nira ConnBerry, Jason A, NP   1 tablet at 09/25/16 0829  . nicotine (NICODERM CQ - dosed in mg/24 hours) patch 21 mg  21 mg Transdermal Daily Jomarie LongsEappen, Saramma, MD   21 mg at 09/25/16 0829  . ondansetron (ZOFRAN-ODT) disintegrating tablet 4 mg  4 mg Oral Q6H PRN Nira ConnBerry, Jason A, NP      . sulfamethoxazole-trimethoprim (BACTRIM DS,SEPTRA DS) 800-160 MG per tablet 1 tablet  1 tablet Oral Q12H Leata MouseJonnalagadda, Maisyn Nouri, MD   1 tablet at 09/25/16 0829  . thiamine (VITAMIN B-1) tablet 100 mg  100 mg Oral Daily Nira ConnBerry, Jason A, NP   100 mg at 09/25/16 0829  . traZODone (DESYREL) tablet 50 mg  50 mg Oral QHS PRN Okonkwo, Justina A, NP        Lab Results: No results found for this or any previous visit (from the past 48 hour(s)).  Blood Alcohol level:  Lab Results  Component Value Date   ETH <5 09/22/2016    Metabolic Disorder Labs: No results found for: HGBA1C, MPG No results found for: PROLACTIN No results found for: CHOL, TRIG, HDL, CHOLHDL, VLDL, LDLCALC  Physical Findings: AIMS: Facial and Oral Movements Muscles of Facial Expression: None, normal Lips and Perioral Area: None, normal Jaw: None, normal Tongue: None, normal,Extremity Movements Upper (arms, wrists, hands, fingers): None, normal Lower (legs, knees, ankles, toes): None, normal, Trunk Movements Neck, shoulders, hips: None, normal, Overall Severity Severity of abnormal movements (highest score from questions  above): None, normal Incapacitation due to abnormal movements: None, normal Patient's awareness of abnormal movements (rate only patient's report): No Awareness, Dental Status Current problems with teeth and/or dentures?: No Does patient usually wear dentures?: No  CIWA:  CIWA-Ar Total: 0 COWS:     Musculoskeletal: Strength & Muscle Tone: within normal limits Gait & Station: normal Patient leans: N/A  Psychiatric Specialty Exam: Physical Exam  ROS  Blood pressure 122/89, pulse (!) 105, temperature 98.1 F (36.7 C), temperature source Oral, resp. rate 18, height 5\' 10"  (1.778 m), weight 70.3 kg (155 lb), SpO2 99 %.Body mass index is 22.24 kg/m.  General Appearance: Casual  Eye Contact:  Good  Speech:  Clear and Coherent  Volume:  Decreased  Mood:  Depressed  Affect:  Constricted and Depressed  Thought Process:  Coherent and Goal Directed  Orientation:  Full (Time,  Place, and Person)  Thought Content:  Rumination  Suicidal Thoughts:  No  Homicidal Thoughts:  No  Memory:  Immediate;   Fair Recent;   Fair Remote;   Fair  Judgement:  Impaired  Insight:  Fair  Psychomotor Activity:  Decreased  Concentration:  Concentration: Fair and Attention Span: Fair  Recall:  Good  Fund of Knowledge:  Good  Language:  Good  Akathisia:  Negative  Handed:  Right  AIMS (if indicated):     Assets:  Communication Skills Desire for Improvement Leisure Time Resilience  ADL's:  Intact  Cognition:  WNL  Sleep:  Number of Hours: 5.25     Treatment Plan Summary: Daily contact with patient to assess and evaluate symptoms and progress in treatment and Medication management   1. Will maintain Q 15 minutes observation for safety. Estimated LOS: 5-7 days 2. Patient will participate in group, milieu, and family therapy. Psychotherapy: Social and Doctor, hospital, anti-bullying, learning based strategies, cognitive behavioral, and family object relations individuation separation  intervention psychotherapies can be considered.  3. Patient has no new labs 4. We'll continue Librium detox protocol to prevent alcohol withdrawal  5. Insomnia: Continue trazodone 50 mg at bedtime  6. Status post surgery of multiple lacerated wounds on both upper extremities, but continue Bactrim DS 1 tablet twice daily has recommended, dressing as recommended.  7. Will continue to monitor patient's mood and behavior. 8. Social Work will schedule a Family meeting to obtain collateral information and discuss discharge and follow up plan.  9. Discharge concerns will also be addressed: Safety, stabilization, and access to medication  Leata Mouse, MD 09/25/2016, 12:03 PM

## 2016-09-25 NOTE — [facility] Group Notes (Signed)
Identifying Needs   Date:  09/25/2016  Time:  1300  Type of Therapy:  Nurse Education  /  The group focuses on teaching patients how to identify their needs and how to develop the skills needed to get them met.   Participation Level:  Did Not Attend  Participation Quality:    Affect:    Cognitive:    Insight:    Engagement in Group:    Modes of Intervention:    Summary of Progress/Problems:  Rex, [place] 09/25/2016, 6:50 PM

## 2016-09-25 NOTE — [facility] Group Notes (Signed)
[facility] LCSW Group Therapy  09/25/2016 10:00 AM  Type of Therapy:  Group Therapy  Participation Level:  Did Not Attend for more than 5 minutes  Summary of Progress/Problems: Topic for today was thoughts, feelings and emotions related to what may lie behind anger. Patient's had opportunity to identify with feelings such as: Jealousy, Hurt, Anxiety, Shame, Sadness, Fear, Frustration, Guilt, Disappointment, Worry, and Embarrassment. Group processed discrepancy and difficulty in admitting to underlying feelings. Pt came into day room briefly before excusing himself and appeared to not feel well.    Carney Bernatherine C Teighlor Korson, LCSW

## 2016-09-25 NOTE — Progress Notes (Signed)
D:  Pt presents with depressed affect and appropriate mood.  Goal for the day was "just to get through it."  Pt denies SI/HI, denies hallucinations, reports L arm pain of 3/10.  Pt reports he is considering going to a 14-day treatment facility after he leaves [facility], reports he is going to "sleep on it."  Pt attended evening group.  He interacts with peers and staff appropriately.  Attended evening group.  A: Met with pt and offered support and encouragement.  Medication administered per order.  PRN medication administered for pain.  Q15 minute safety checks maintained.  R: Pt is compliant with medications.  He verbally contracts for safety.  Will continue to monitor and assess.

## 2016-09-25 NOTE — Plan of Care (Signed)
Problem: Activity: Goal: Sleeping patterns will improve Outcome: Progressing Pt slept 6.25 hours last night per flowsheet.
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
## 2016-09-26 DIAGNOSIS — F1010 Alcohol abuse, uncomplicated: Secondary | ICD-10-CM

## 2016-09-26 LAB — GLUCOSE, CAPILLARY: GLUCOSE-CAPILLARY: 94 mg/dL (ref 65–99)

## 2016-09-26 MED ORDER — MAGNESIUM CITRATE PO SOLN
1.0000 | Freq: Once | ORAL | Status: AC
  Administered 2016-09-26: 1 via ORAL

## 2016-09-26 MED ORDER — SERTRALINE HCL 50 MG PO TABS
50.0000 mg | ORAL_TABLET | Freq: Every day | ORAL | Status: DC
  Administered 2016-09-26 – 2016-09-30 (×5): 50 mg via ORAL
  Filled 2016-09-26 (×3): qty 1
  Filled 2016-09-26 (×2): qty 14
  Filled 2016-09-26: qty 1
  Filled 2016-09-26: qty 14
  Filled 2016-09-26 (×2): qty 1

## 2016-09-26 NOTE — Progress Notes (Signed)
Patient ID: DEPASSat Sibert, male   DOB: [birth year], 61 y.o.   MRN: [id number]  Pt currently presents with a flat, apprehensive affect and cooperative, guarded behavior. Interaction with patient is pleasant, pt divulges much of how his day went and his consultation with the MD. Then when patient is asked about his mental state, pt refuses to answer verbally, just shakes or nods his head. Pt reports good sleep with current medication regimen. Presents with signs and symptoms of withdrawal including generalized aches and fatigue.     Pt provided with medications per providers orders. Pt's labs and vitals were monitored throughout the night. Pt given a 1:1 about emotional and mental status. Pt supported and encouraged to express concerns and questions. Pt educated on medications and suicide prevention precautions. Assessed patients bilaterall arm casts, no drainage noted, intact. Pt did report some irritation on his knuckles on his R hand. Guaze pad placed as barrier. Educated on infection prevention measures.   Pt's safety ensured with 15 minute and environmental checks. Pt currently denies SI/HI and A/V hallucinations by shaking his head no. Pt does verbally agrees to seek staff if SI/HI or A/VH occurs and to consult with staff before acting on any harmful thoughts. Will continue POC.

## 2016-09-26 NOTE — Tx Team (Signed)
Interdisciplinary Treatment and Diagnostic Plan Update  09/26/2016 Time of Session: 0830AM Andrew Maldonado MRN: [id number]  Principal Diagnosis: MDD, recurrent, severe   Secondary Diagnoses: Active Problems:   MDD (major depressive disorder)   Current Medications:  Current Facility-Administered Medications  Medication Dose Route Frequency Provider Last Rate Last Dose  . acetaminophen (TYLENOL) tablet 650 mg  650 mg Oral Q6H PRN Okonkwo, Justina A, NP   650 mg at 09/24/16 2108  . alum & mag hydroxide-simeth (MAALOX/MYLANTA) 200-200-20 MG/5ML suspension 30 mL  30 mL Oral Q4H PRN Okonkwo, Justina A, NP      . chlordiazePOXIDE (LIBRIUM) capsule 25 mg  25 mg Oral Q6H PRN Lindon Romp A, NP      . chlordiazePOXIDE (LIBRIUM) capsule 25 mg  25 mg Oral BH-qamhs Rozetta Nunnery, NP       Followed by  . [START ON 09/28/2016] chlordiazePOXIDE (LIBRIUM) capsule 25 mg  25 mg Oral Daily Lindon Romp A, NP      . hydrOXYzine (ATARAX/VISTARIL) tablet 25 mg  25 mg Oral Q6H PRN Lindon Romp A, NP      . ibuprofen (ADVIL,MOTRIN) tablet 600 mg  600 mg Oral Q6H PRN Mordecai Maes, NP   600 mg at 09/25/16 1649  . loperamide (IMODIUM) capsule 2-4 mg  2-4 mg Oral PRN Lindon Romp A, NP      . magnesium citrate solution 1 Bottle  1 Bottle Oral Once Nwoko, Agnes I, NP      . magnesium hydroxide (MILK OF MAGNESIA) suspension 30 mL  30 mL Oral Daily PRN Okonkwo, Justina A, NP   30 mL at 09/25/16 0831  . multivitamin with minerals tablet 1 tablet  1 tablet Oral Daily Lindon Romp A, NP   1 tablet at 09/26/16 0830  . nicotine (NICODERM CQ - dosed in mg/24 hours) patch 21 mg  21 mg Transdermal Daily Eappen, Saramma, MD   21 mg at 09/26/16 0830  . ondansetron (ZOFRAN-ODT) disintegrating tablet 4 mg  4 mg Oral Q6H PRN Lindon Romp A, NP      . sulfamethoxazole-trimethoprim (BACTRIM DS,SEPTRA DS) 800-160 MG per tablet 1 tablet  1 tablet Oral Q12H Ambrose Finland, MD   1 tablet at 09/26/16 0830  . thiamine (VITAMIN B-1)  tablet 100 mg  100 mg Oral Daily Lindon Romp A, NP   100 mg at 09/26/16 0830  . traZODone (DESYREL) tablet 50 mg  50 mg Oral QHS PRN Lu Duffel, Justina A, NP       PTA Medications: Prescriptions Prior to Admission  Medication Sig Dispense Refill Last Dose  . acetaminophen (TYLENOL) 325 MG tablet Take 325-650 mg by mouth every 6 (six) hours as needed (for pain or headaches).   PRN at PRN  . Camphor-Eucalyptus-Menthol (VICKS VAPORUB) 4.7-1.2-2.6 % OINT Apply 1 application topically at bedtime as needed (under the nose for congestion).   PRN at PRN  . HYDROcodone-acetaminophen (NORCO) 5-325 MG tablet 1-2 tabs po q6 hours prn pain 20 tablet 0   . ibuprofen (ADVIL,MOTRIN) 200 MG tablet Take 200-400 mg by mouth every 6 (six) hours as needed (for pain or headaches).   PRN at PRN  . Multiple Vitamins-Minerals (ONE-A-DAY MENS HEALTH FORMULA) TABS Take 1 tablet by mouth daily with supper.   09/21/2016 at pm  . Omega-3 Fatty Acids (FISH OIL PO) Take 1 capsule by mouth daily with supper.   09/21/2016 at pm  . POTASSIUM PO Take 1 tablet by mouth daily with supper.   09/21/2016 at  pm  . sulfamethoxazole-trimethoprim (BACTRIM DS) 800-160 MG tablet Take 1 tablet by mouth 2 (two) times daily. 14 tablet 0   . trolamine salicylate (ASPERCREME) 10 % cream Apply 1 application topically 2 (two) times daily as needed for muscle pain.   PRN at PRN    Patient Stressors: Financial difficulties Substance abuse  Patient Strengths: Capable of independent living General fund of knowledge Physical Health  Treatment Modalities: Medication Management, Group therapy, Case management,  1 to 1 session with clinician, Psychoeducation, Recreational therapy.   Physician Treatment Plan for Primary Diagnosis: MDD, recurrent, severe  Long Term Goal(s): Improvement in symptoms so as ready for discharge Improvement in symptoms so as ready for discharge   Short Term Goals: Ability to identify changes in lifestyle to reduce  recurrence of condition will improve Ability to verbalize feelings will improve Ability to disclose and discuss suicidal ideas Ability to demonstrate self-control will improve Ability to identify and develop effective coping behaviors will improve Ability to maintain clinical measurements within normal limits will improve Compliance with prescribed medications will improve Ability to identify triggers associated with substance abuse/mental health issues will improve  Medication Management: Evaluate patient's response, side effects, and tolerance of medication regimen.  Therapeutic Interventions: 1 to 1 sessions, Unit Group sessions and Medication administration.  Evaluation of Outcomes: Not Met  Physician Treatment Plan for Secondary Diagnosis: Active Problems:   MDD (major depressive disorder)  Long Term Goal(s): Improvement in symptoms so as ready for discharge Improvement in symptoms so as ready for discharge   Short Term Goals: Ability to identify changes in lifestyle to reduce recurrence of condition will improve Ability to verbalize feelings will improve Ability to disclose and discuss suicidal ideas Ability to demonstrate self-control will improve Ability to identify and develop effective coping behaviors will improve Ability to maintain clinical measurements within normal limits will improve Compliance with prescribed medications will improve Ability to identify triggers associated with substance abuse/mental health issues will improve     Medication Management: Evaluate patient's response, side effects, and tolerance of medication regimen.  Therapeutic Interventions: 1 to 1 sessions, Unit Group sessions and Medication administration.  Evaluation of Outcomes: Not Met   RN Treatment Plan for Primary Diagnosis: MDD, recurrent, severe  Long Term Goal(s): Knowledge of disease and therapeutic regimen to maintain health will improve  Short Term Goals: Ability to remain free  from injury will improve, Ability to verbalize feelings will improve and Ability to disclose and discuss suicidal ideas  Medication Management: RN will administer medications as ordered by provider, will assess and evaluate patient's response and provide education to patient for prescribed medication. RN will report any adverse and/or side effects to prescribing provider.  Therapeutic Interventions: 1 on 1 counseling sessions, Psychoeducation, Medication administration, Evaluate responses to treatment, Monitor vital signs and CBGs as ordered, Perform/monitor CIWA, COWS, AIMS and Fall Risk screenings as ordered, Perform wound care treatments as ordered.  Evaluation of Outcomes: Not Met   LCSW Treatment Plan for Primary Diagnosis: MDD, recurrent, severe  Long Term Goal(s): Safe transition to appropriate next level of care at discharge, Engage patient in therapeutic group addressing interpersonal concerns.  Short Term Goals: Engage patient in aftercare planning with referrals and resources, Facilitate patient progression through stages of change regarding substance use diagnoses and concerns and Identify triggers associated with mental health/substance abuse issues  Therapeutic Interventions: Assess for all discharge needs, 1 to 1 time with Social worker, Explore available resources and support systems, Assess for adequacy in community  support network, Educate family and significant other(s) on suicide prevention, Complete Psychosocial Assessment, Interpersonal group therapy.  Evaluation of Outcomes: Not Met   Progress in Treatment: Attending groups: Yes. Participating in groups: Yes. Taking medication as prescribed: Yes. Toleration medication: Yes. Family/Significant other contact made: No, will contact:  family member if patient consents Patient understands diagnosis: Yes. Discussing patient identified problems/goals with staff: Yes. Medical problems stabilized or resolved: Yes. Denies  suicidal/homicidal ideation: Yes. Issues/concerns per patient self-inventory: No. Other: n/a   New problem(s) identified: No, Describe:  n/a  New Short Term/Long Term Goal(s):  Discharge Plan or Barriers: CSW assessing--[facility] screening for admission scheduled for Thursday, 09/29/16  Reason for Continuation of Hospitalization: Anxiety Depression Medication stabilization Suicidal ideation Withdrawal symptoms  Estimated Length of Stay: Thursday morning 09/29/16. (pt has screening at [facility] that morning).   Attendees: Patient: 09/26/2016 11:23 AM  Physician: Dr. Parke Poisson MD; Dr. Dwyane Dee MD 09/26/2016 11:23 AM  Nursing: Vallery Ridge; Christa RN 09/26/2016 11:23 AM  RN Care Manager: Lars Pinks CM 09/26/2016 11:23 AM  Social Worker: Maxie Better, LCSW 09/26/2016 11:23 AM  Recreational Therapist: x 09/26/2016 11:23 AM  Other: Ricky Ala NP; Mordecai Maes NP; Darnelle Maffucci Money NP 09/26/2016 11:23 AM  Other:  09/26/2016 11:23 AM  Other: 09/26/2016 11:23 AM    Scribe for Treatment Team: [place], LCSW 09/26/2016 11:23 AM

## 2016-09-26 NOTE — Progress Notes (Signed)
Recreation Therapy Notes  Date: 09/26/2016 Time: 9:30am Location: [address] Dayroom  Group Topic: Stress Management  Goal Area(s) Addresses:  Patient will verbalize importance of using healthy stress management.  Patient will identify positive emotions associated with healthy stress management.   Behavioral Response: Engaged  Intervention: Stress Management  Activity :  Guided Body Scan. Recreation Therapy Intern introduced the stress management technique of guided body scanning. Recreation Therapy Intern played a YouTube video that allowed patients to mentally scan their body for areas of tensions. Patients were to follow along as video was played to engage in the activity.  Education: Stress Management, Discharge Planning.   Education Outcome: Acknowledges edcuation  Clinical Observations/Feedback: Pt attended group.  Arlynn Stare, Recreation Therapy Intern 

## 2016-09-26 NOTE — Progress Notes (Signed)
Patient ID: Andrew Maldonado, male   DOB: November 28, 1955, 61 y.o.   MRN: [id number]  DAR: Pt. Denies SI/HI and A/V Hallucinations. He reports sleep is good, appetite is good, energy level is normal, and concentration is good. He rates depression, hopelessness, and anxiety 1/10. Patient does not report any pain or discomfort at this time. His ace bandages on his bilateral upper extremities are dry, clean, and intact. Support and encouragement provided to the patient. Scheduled medications administered to patient per physician's orders. Patient is minimal but cooperative at this time. He is seen in the milieu intermittently. Q15 minute checks are maintained for safety.

## 2016-09-26 NOTE — Progress Notes (Signed)
[facility] MD Progress Note  09/26/2016 3:49 PM Andrew Maldonado  MRN:  [id number]   Subjective:  Patient reports that he is doing well on recovering and wants to continue his medication and treatment to be come sober. He reports drinking 3-4 beers a day for numerous years. He denies any SI/HI/AVH currently, but admits moderate depression and states it is possible that the depression pushes him to drink.   Objective: Patient is pleasant and cooperative. He is still wearing braces and ace wraps bilateral upper extremities. He shows no signs or symptoms of current withdrawal symptoms.  Principal Problem: MDD (major depressive disorder) Diagnosis:   Patient Active Problem List   Diagnosis Date Noted  . ETOH abuse [F10.10] 09/26/2016  . MDD (major depressive disorder) [F32.9] 09/23/2016   Total Time spent with patient: 25 minutes  Past Psychiatric History: See H&P  Past Medical History: History reviewed. No pertinent past medical history.  Past Surgical History:  Procedure Laterality Date  . FLEXOR TENDON REPAIR Bilateral 09/22/2016   Procedure: FLEXOR TENDON REPAIR;  Surgeon: Betha LoaKuzma, Kevin, MD;  [facility];  Service: Orthopedics;  Laterality: Bilateral;  . I&D EXTREMITY Bilateral 09/22/2016   Procedure: IRRIGATION AND DEBRIDEMENT EXTREMITY;  Surgeon: Betha LoaKuzma, Kevin, MD;  Location: [facility] OR;  Service: Orthopedics;  Laterality: Bilateral;  . NERVE, TENDON AND ARTERY REPAIR N/A 09/22/2016   Procedure: BILATERAL ARM EXPLORATION POSSIBLE NERVE, TENDON AND ARTERY REPAIR AS NEEDED;  Surgeon: Betha LoaKuzma, Kevin, MD;  Location: [facility] OR;  Service: Orthopedics;  Laterality: N/A;  . REPAIR EXTENSOR TENDON Bilateral 09/22/2016   Procedure: REPAIR EXTENSOR TENDON;  Surgeon: Betha LoaKuzma, Kevin, MD;  Location: [facility] OR;  Service: Orthopedics;  Laterality: Bilateral;   Family History: History reviewed. No pertinent family history. Family Psychiatric  History: See H&P Social History:  History  Alcohol Use  . 2.4 oz/week  . 4 Cans of  beer per week     History  Drug Use No    Social History   Social History  . Marital status: Divorced    Spouse name: N/A  . Number of children: N/A  . Years of education: N/A   Social History Main Topics  . Smoking status: Current Every Day Smoker  . Smokeless tobacco: Never Used  . Alcohol use 2.4 oz/week    4 Cans of beer per week  . Drug use: No  . Sexual activity: Not Currently   Other Topics Concern  . None   Social History Narrative  . None   Additional Social History:                         Sleep: Good  Appetite:  Good  Current Medications: Current Facility-Administered Medications  Medication Dose Route Frequency Provider Last Rate Last Dose  . acetaminophen (TYLENOL) tablet 650 mg  650 mg Oral Q6H PRN Okonkwo, Justina A, NP   650 mg at 09/24/16 2108  . alum & mag hydroxide-simeth (MAALOX/MYLANTA) 200-200-20 MG/5ML suspension 30 mL  30 mL Oral Q4H PRN Okonkwo, Justina A, NP      . chlordiazePOXIDE (LIBRIUM) capsule 25 mg  25 mg Oral Q6H PRN Nira ConnBerry, Jason A, NP      . chlordiazePOXIDE (LIBRIUM) capsule 25 mg  25 mg Oral BH-qamhs Jackelyn PolingBerry, Jason A, NP       Followed by  . [START ON 09/28/2016] chlordiazePOXIDE (LIBRIUM) capsule 25 mg  25 mg Oral Daily Nira ConnBerry, Jason A, NP      . hydrOXYzine (ATARAX/VISTARIL)  tablet 25 mg  25 mg Oral Q6H PRN Nira ConnBerry, Jason A, NP      . ibuprofen (ADVIL,MOTRIN) tablet 600 mg  600 mg Oral Q6H PRN Denzil Magnusonhomas, Lashunda, NP   600 mg at 09/25/16 1649  . loperamide (IMODIUM) capsule 2-4 mg  2-4 mg Oral PRN Nira ConnBerry, Jason A, NP      . magnesium hydroxide (MILK OF MAGNESIA) suspension 30 mL  30 mL Oral Daily PRN Okonkwo, Justina A, NP   30 mL at 09/25/16 0831  . multivitamin with minerals tablet 1 tablet  1 tablet Oral Daily Nira ConnBerry, Jason A, NP   1 tablet at 09/26/16 0830  . nicotine (NICODERM CQ - dosed in mg/24 hours) patch 21 mg  21 mg Transdermal Daily Eappen, Saramma, MD   21 mg at 09/26/16 0830  . ondansetron (ZOFRAN-ODT) disintegrating  tablet 4 mg  4 mg Oral Q6H PRN Nira ConnBerry, Jason A, NP      . sertraline (ZOLOFT) tablet 50 mg  50 mg Oral Daily Abigaile Rossie B, FNP   50 mg at 09/26/16 1524  . sulfamethoxazole-trimethoprim (BACTRIM DS,SEPTRA DS) 800-160 MG per tablet 1 tablet  1 tablet Oral Q12H Leata MouseJonnalagadda, Janardhana, MD   1 tablet at 09/26/16 0830  . thiamine (VITAMIN B-1) tablet 100 mg  100 mg Oral Daily Nira ConnBerry, Jason A, NP   100 mg at 09/26/16 0830  . traZODone (DESYREL) tablet 50 mg  50 mg Oral QHS PRN Okonkwo, Justina A, NP        Lab Results: No results found for this or any previous visit (from the past 48 hour(s)).  Blood Alcohol level:  Lab Results  Component Value Date   ETH <5 09/22/2016    Metabolic Disorder Labs: No results found for: HGBA1C, MPG No results found for: PROLACTIN No results found for: CHOL, TRIG, HDL, CHOLHDL, VLDL, LDLCALC  Physical Findings: AIMS: Facial and Oral Movements Muscles of Facial Expression: None, normal Lips and Perioral Area: None, normal Jaw: None, normal Tongue: None, normal,Extremity Movements Upper (arms, wrists, hands, fingers): None, normal Lower (legs, knees, ankles, toes): None, normal, Trunk Movements Neck, shoulders, hips: None, normal, Overall Severity Severity of abnormal movements (highest score from questions above): None, normal Incapacitation due to abnormal movements: None, normal Patient's awareness of abnormal movements (rate only patient's report): No Awareness, Dental Status Current problems with teeth and/or dentures?: No Does patient usually wear dentures?: No  CIWA:  CIWA-Ar Total: 0 COWS:     Musculoskeletal: Strength & Muscle Tone: within normal limits Gait & Station: normal Patient leans: N/A  Psychiatric Specialty Exam: Physical Exam  Nursing note and vitals reviewed. Constitutional: He is oriented to person, place, and time. He appears well-developed and well-nourished.  Musculoskeletal: Normal range of motion.  Neurological: He is  alert and oriented to person, place, and time.  Skin: Skin is warm.    Review of Systems  Constitutional: Negative.   HENT: Negative.   Eyes: Negative.   Respiratory: Negative.   Cardiovascular: Negative.   Gastrointestinal: Negative.   Genitourinary: Negative.   Musculoskeletal: Negative.   Skin: Negative.   Neurological: Negative.   Endo/Heme/Allergies: Negative.     Blood pressure 123/84, pulse (!) 104, temperature 97.8 F (36.6 C), temperature source Oral, resp. rate 18, height 5\' 10"  (1.778 m), weight 70.3 kg (155 lb), SpO2 99 %.Body mass index is 22.24 kg/m.  General Appearance: Casual  Eye Contact:  Good  Speech:  Clear and Coherent and Normal Rate  Volume:  Normal  Mood:  Euthymic  Affect:  Flat  Thought Process:  Coherent and Descriptions of Associations: Intact  Orientation:  Full (Time, Place, and Person)  Thought Content:  WDL  Suicidal Thoughts:  No  Homicidal Thoughts:  No  Memory:  Immediate;   Good Recent;   Good  Judgement:  Good  Insight:  Good  Psychomotor Activity:  Normal  Concentration:  Concentration: Good and Attention Span: Good  Recall:  Good  Fund of Knowledge:  Good  Language:  Good  Akathisia:  No  Handed:  Right  AIMS (if indicated):     Assets:  Housing Social Support  ADL's:  Intact  Cognition:  WNL  Sleep:  Number of Hours: 5.75     Treatment Plan Summary: Daily contact with patient to assess and evaluate symptoms and progress in treatment, Medication management and Plan is to:  -Start Zoloft 50 mg PO Daily for depression -Continue CIWA protocol for ETOH detox -Encourage group therapy participation -[facility] screening on 09-28-16  Maryfrances Bunnell, FNP 09/26/2016, 3:49 PM

## 2016-09-26 NOTE — Progress Notes (Signed)
D: Pt was in the dayroom upon initial approach.  Pt presents with appropriate affect and mood.  Describes his day as "okay" and reports goal is "just to be better."  Pt denies SI/HI, denies hallucinations, denies pain.  Pt has been visible in milieu interacting with peers and staff appropriately.  Pt attended evening group.    A: Introduced self to pt.  Actively listened to pt and offered support and encouragement. Medication administered per order.  Q15 minute safety checks maintained.  R: Pt is safe on the unit.  Pt is compliant with medication.  Pt verbally contracts for safety.  Will continue to monitor and assess.

## 2016-09-26 NOTE — [facility] Suicide Risk Assessment (Signed)
[facility] INPATIENT:  Family/Significant Other Suicide Prevention Education  Suicide Prevention Education:  Patient Refusal for Family/Significant Other Suicide Prevention Education: The patient ESCOTOat [place] has refused to provide written consent for family/significant other to be provided Family/Significant Other Suicide Prevention Education during admission and/or prior to discharge.  Physician notified.  Shuree Brossart L Arianni Gallego MSW, LCSW  09/26/2016, 11:04 AM

## 2016-09-27 DIAGNOSIS — F1010 Alcohol abuse, uncomplicated: Secondary | ICD-10-CM

## 2016-09-27 DIAGNOSIS — F322 Major depressive disorder, single episode, severe without psychotic features: Secondary | ICD-10-CM

## 2016-09-27 DIAGNOSIS — F17210 Nicotine dependence, cigarettes, uncomplicated: Secondary | ICD-10-CM

## 2016-09-27 NOTE — Progress Notes (Signed)
Patient ID: PAR[place]at [place], male   DOB: 01-Oct-1955, 61 y.o.   MRN: [id number]  Pt currently presents with a flat affect and depressed behavior. Pt reports to writer that he accomplished his goal today by "not having a drink or smoke." Pt reports he is waiting to "hear back from a rehab on Thursday." Pt states "It would be good to have someone I can talk to in person every day." Pt reports good sleep with current medication regimen.   Pt provided with medications per providers orders. Pt's labs and vitals were monitored throughout the night. Pt given a 1:1 about emotional and mental status. Pt supported and encouraged to express concerns and questions. Pt educated on medications. Pt casts assessed, no drainage noted, good capillary refill.   Pt's safety ensured with 15 minute and environmental checks. Pt currently denies SI/HI and A/V hallucinations. Pt verbally agrees to seek staff if SI/HI or A/VH occurs and to consult with staff before acting on any harmful thoughts. Will continue POC.

## 2016-09-27 NOTE — Progress Notes (Signed)
Patient denies SI, HI and AVH this shift.  Patietn reports a decrease in depression and anxiety.  Patient has been actively engaged in groups activities and compliant with therapy.  Patient states he hopes to be able to discharge soon.   Assess patient for safety, offer medications as prescribed, engage patient in 1:1 staff talks.   Continue to monitor as prescribed. PAtietn able to contract for safety.
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
## 2016-09-28 ENCOUNTER — Encounter (HOSPITAL_COMMUNITY): Payer: Self-pay | Admitting: Behavioral Health

## 2016-09-28 DIAGNOSIS — F1910 Other psychoactive substance abuse, uncomplicated: Secondary | ICD-10-CM

## 2016-09-28 MED ORDER — NICOTINE 21 MG/24HR TD PT24: 21.0000 mg | MEDICATED_PATCH | Freq: Every day | TRANSDERMAL | 0 refills | Status: AC

## 2016-09-28 MED ORDER — SERTRALINE HCL 50 MG PO TABS: 50.0000 mg | ORAL_TABLET | Freq: Every day | ORAL | 0 refills | Status: DC

## 2016-09-28 MED ORDER — HYDROXYZINE HCL 25 MG PO TABS: 25.0000 mg | ORAL_TABLET | Freq: Four times a day (QID) | ORAL | Status: DC | PRN

## 2016-09-28 MED ORDER — TRAZODONE HCL 50 MG PO TABS: 50.0000 mg | ORAL_TABLET | Freq: Every evening | ORAL | 0 refills | Status: AC | PRN

## 2016-09-28 MED ORDER — HYDROXYZINE HCL 25 MG PO TABS
25.0000 mg | ORAL_TABLET | Freq: Four times a day (QID) | ORAL | Status: DC | PRN
  Administered 2016-09-28 – 2016-10-02 (×4): 25 mg via ORAL
  Filled 2016-09-28 (×4): qty 1

## 2016-09-28 MED ORDER — SULFAMETHOXAZOLE-TRIMETHOPRIM 800-160 MG PO TABS: 1.0000 | ORAL_TABLET | Freq: Two times a day (BID) | ORAL | Status: DC

## 2016-09-28 NOTE — Progress Notes (Signed)
Pt attended NA meeting this evening.  

## 2016-09-28 NOTE — Tx Team (Signed)
Interdisciplinary Treatment and Diagnostic Plan Update  09/28/2016 Time of Session: 0830AM Andrew Maldonado MRN: [id number]  Principal Diagnosis: MDD, recurrent, severe   Secondary Diagnoses: Principal Problem:   MDD (major depressive disorder) Active Problems:   ETOH abuse   Current Medications:  Current Facility-Administered Medications  Medication Dose Route Frequency Provider Last Rate Last Dose  . acetaminophen (TYLENOL) tablet 650 mg  650 mg Oral Q6H PRN Okonkwo, Justina A, NP   650 mg at 09/24/16 2108  . alum & mag hydroxide-simeth (MAALOX/MYLANTA) 200-200-20 MG/5ML suspension 30 mL  30 mL Oral Q4H PRN Okonkwo, Justina A, NP      . ibuprofen (ADVIL,MOTRIN) tablet 600 mg  600 mg Oral Q6H PRN Denzil Magnusonhomas, Lashunda, NP   600 mg at 09/25/16 1649  . magnesium hydroxide (MILK OF MAGNESIA) suspension 30 mL  30 mL Oral Daily PRN Okonkwo, Justina A, NP   30 mL at 09/25/16 0831  . multivitamin with minerals tablet 1 tablet  1 tablet Oral Daily Nira ConnBerry, Jason A, NP   1 tablet at 09/28/16 0823  . nicotine (NICODERM CQ - dosed in mg/24 hours) patch 21 mg  21 mg Transdermal Daily Jomarie LongsEappen, Saramma, MD   21 mg at 09/28/16 0823  . sertraline (ZOLOFT) tablet 50 mg  50 mg Oral Daily Money, LUCARELLIravis B, FNP   50 mg at 09/28/16 [id number]  . sulfamethoxazole-trimethoprim (BACTRIM DS,SEPTRA DS) 800-160 MG per tablet 1 tablet  1 tablet Oral Q12H Leata MouseJonnalagadda, Janardhana, MD   1 tablet at 09/28/16 0823  . thiamine (VITAMIN B-1) tablet 100 mg  100 mg Oral Daily Nira ConnBerry, Jason A, NP   100 mg at 09/28/16 [id number]  . traZODone (DESYREL) tablet 50 mg  50 mg Oral QHS PRN Beryle Lathekonkwo, Justina A, NP   50 mg at 09/27/16 2206   PTA Medications: Prescriptions Prior to Admission  Medication Sig Dispense Refill Last Dose  . acetaminophen (TYLENOL) 325 MG tablet Take 325-650 mg by mouth every 6 (six) hours as needed (for pain or headaches).   PRN at PRN  . Camphor-Eucalyptus-Menthol (VICKS VAPORUB) 4.7-1.2-2.6 % OINT Apply 1 application topically at  bedtime as needed (under the nose for congestion).   PRN at PRN  . HYDROcodone-acetaminophen (NORCO) 5-325 MG tablet 1-2 tabs po q6 hours prn pain 20 tablet 0   . ibuprofen (ADVIL,MOTRIN) 200 MG tablet Take 200-400 mg by mouth every 6 (six) hours as needed (for pain or headaches).   PRN at PRN  . Multiple Vitamins-Minerals (ONE-A-DAY MENS HEALTH FORMULA) TABS Take 1 tablet by mouth daily with supper.   09/21/2016 at pm  . Omega-3 Fatty Acids (FISH OIL PO) Take 1 capsule by mouth daily with supper.   09/21/2016 at pm  . POTASSIUM PO Take 1 tablet by mouth daily with supper.   09/21/2016 at pm  . sulfamethoxazole-trimethoprim (BACTRIM DS) 800-160 MG tablet Take 1 tablet by mouth 2 (two) times daily. 14 tablet 0   . trolamine salicylate (ASPERCREME) 10 % cream Apply 1 application topically 2 (two) times daily as needed for muscle pain.   PRN at PRN    Patient Stressors: Financial difficulties Substance abuse  Patient Strengths: Capable of independent living General fund of knowledge Physical Health  Treatment Modalities: Medication Management, Group therapy, Case management,  1 to 1 session with clinician, Psychoeducation, Recreational therapy.   Physician Treatment Plan for Primary Diagnosis: MDD, recurrent, severe  Long Term Goal(s): Improvement in symptoms so as ready for discharge Improvement in symptoms so as ready for  discharge   Short Term Goals: Ability to identify changes in lifestyle to reduce recurrence of condition will improve Ability to verbalize feelings will improve Ability to disclose and discuss suicidal ideas Ability to demonstrate self-control will improve Ability to identify and develop effective coping behaviors will improve Ability to maintain clinical measurements within normal limits will improve Compliance with prescribed medications will improve Ability to identify triggers associated with substance abuse/mental health issues will improve  Medication Management:  Evaluate patient's response, side effects, and tolerance of medication regimen.  Therapeutic Interventions: 1 to 1 sessions, Unit Group sessions and Medication administration.  Evaluation of Outcomes: Adequate for discharge   Physician Treatment Plan for Secondary Diagnosis: Principal Problem:   MDD (major depressive disorder) Active Problems:   ETOH abuse  Long Term Goal(s): Improvement in symptoms so as ready for discharge Improvement in symptoms so as ready for discharge   Short Term Goals: Ability to identify changes in lifestyle to reduce recurrence of condition will improve Ability to verbalize feelings will improve Ability to disclose and discuss suicidal ideas Ability to demonstrate self-control will improve Ability to identify and develop effective coping behaviors will improve Ability to maintain clinical measurements within normal limits will improve Compliance with prescribed medications will improve Ability to identify triggers associated with substance abuse/mental health issues will improve     Medication Management: Evaluate patient's response, side effects, and tolerance of medication regimen.  Therapeutic Interventions: 1 to 1 sessions, Unit Group sessions and Medication administration.  Evaluation of Outcomes: Adequate for discharge    RN Treatment Plan for Primary Diagnosis: MDD, recurrent, severe  Long Term Goal(s): Knowledge of disease and therapeutic regimen to maintain health will improve  Short Term Goals: Ability to remain free from injury will improve, Ability to verbalize feelings will improve and Ability to disclose and discuss suicidal ideas  Medication Management: RN will administer medications as ordered by provider, will assess and evaluate patient's response and provide education to patient for prescribed medication. RN will report any adverse and/or side effects to prescribing provider.  Therapeutic Interventions: 1 on 1 counseling sessions,  Psychoeducation, Medication administration, Evaluate responses to treatment, Monitor vital signs and CBGs as ordered, Perform/monitor CIWA, COWS, AIMS and Fall Risk screenings as ordered, Perform wound care treatments as ordered.  Evaluation of Outcomes: Adequate for discharge    LCSW Treatment Plan for Primary Diagnosis: MDD, recurrent, severe  Long Term Goal(s): Safe transition to appropriate next level of care at discharge, Engage patient in therapeutic group addressing interpersonal concerns.  Short Term Goals: Engage patient in aftercare planning with referrals and resources, Facilitate patient progression through stages of change regarding substance use diagnoses and concerns and Identify triggers associated with mental health/substance abuse issues  Therapeutic Interventions: Assess for all discharge needs, 1 to 1 time with Social worker, Explore available resources and support systems, Assess for adequacy in community support network, Educate family and significant other(s) on suicide prevention, Complete Psychosocial Assessment, Interpersonal group therapy.  Evaluation of Outcomes: Adequate for discharge    Progress in Treatment: Attending groups: Yes. Participating in groups: Yes. Taking medication as prescribed: Yes. Toleration medication: Yes. Family/Significant other contact made: SPE completed with pt; pt declined to consent to family contact.  Patient understands diagnosis: Yes. Discussing patient identified problems/goals with staff: Yes. Medical problems stabilized or resolved: Yes. Denies suicidal/homicidal ideation: Yes. Issues/concerns per patient self-inventory: No. Other: n/a   New problem(s) identified: No, Describe:  n/a  New Short Term/Long Term Goal(s): elimination of SI thoughts;  medication management for detox/mood stabilization; development of comprehensive mental wellness/sobriety plan.   Discharge Plan or Barriers: [facility] screening for  admission scheduled for Thursday, 09/29/16; [facility] for outpatient mental health services. [facility] pamphlet for additional community support/resources.   Reason for Continuation of Hospitalization: medication management  Estimated Length of Stay: Thursday morning 09/29/16. ([facility]).   Attendees: Patient: 09/28/2016 11:03 AM  Physician: Dr. Jama Flavorsobos MD; Dr. Jackquline BerlinIzediuno MD 09/28/2016 11:03 AM  Nursing: Armando ReichertMarian RN; Christa RN 09/28/2016 11:03 AM  RN Care Manager: Onnie BoerJennifer Clark CM 09/28/2016 11:03 AM  Social Worker: Chartered loss adjusterHeather Smart, LCSW 09/28/2016 11:03 AM  Recreational Therapist: x 09/28/2016 11:03 AM  Other: Hillery Jacksanika Lewis NP; Denzil MagnusonLashunda Thomas NP; VAILEravis Money NP 09/28/2016 11:03 AM  Other:  09/28/2016 11:03 AM  Other: 09/28/2016 11:03 AM    Scribe for Treatment Team: Ledell PeoplesHeather N Smart, LCSW 09/28/2016 11:03 AM

## 2016-09-28 NOTE — Progress Notes (Signed)
Adult Psychoeducational Group Note  Date:  09/28/2016 Time:  1:22 AM  Group Topic/Focus:  Wrap-Up Group:   The focus of this group is to help patients review their daily goal of treatment and discuss progress on daily workbooks.  Participation Level:  Active  Participation Quality:  Appropriate  Affect:  Appropriate  Cognitive:  Appropriate  Insight: Appropriate  Engagement in Group:  Engaged  Modes of Intervention:  Discussion  Additional Comments:  Pt stated his goal for today was to get plenty of rest. Pt stated he accomplished his goal today. Pt rated his overall day a 6 out of 10. Pt stated he meet a new peer which help him improve his day.  UTZChristopher  Narmeen Kerper 09/28/2016, 1:22 AM

## 2016-09-28 NOTE — Progress Notes (Signed)
[facility] Note  09/28/2016 12:48 PM FIUMARAat Hammes  MRN:  [id number]  Subjective: " I am doing pretty good. WILMESFell like things have improved. We talked about me going to long term rehab after I leave here and I hadn't heard anything else about it."    Evaluation on the unit: Face to face evaluation completed, case discussed during treatment team, chart reviewed.  Andrew Maldonado is a 61 yo Caucasian male, divorced. Background history of AUD and mood disorder. Presented to the ER via EMS. Patient was picked up from his motel room. He was initially voluntary and later committed. He was stated to have lacerated his forearms with a razor. He was stated to have left a suicide note for his daughter. UDS was positive for Benzodizepines.  During this evaluation, patient is alert and oriented x4, calm and cooperative. He seems to have good insight regarding his current mental health stats and treatment. He presents initially flat and depressed however he brightens on approach. He endorses improvement in depressive symptoms, anxiety and hopelessness rating all as 2/10 with 0 being none and 10 being the worse. He is scheduled to be [facility]     and he [facility]. He denies SI, HI or AVH at this time. Reports medications are well tolerated and without side effects. Endorses good appetite and sleeping pattern. At this time, he is able to contract and maintain safety on the unit.       Principal Problem: MDD (major depressive disorder) Diagnosis:   Patient Active Problem List   Diagnosis Date Noted  . ETOH abuse [F10.10] 09/26/2016  . MDD (major depressive disorder) [F32.9] 09/23/2016   Total Time spent with patient: 20 minutes  Past Psychiatric History: As in H&P  Past Medical History: History reviewed. No pertinent past medical history.  Past Surgical History:  Procedure Laterality Date  . FLEXOR TENDON REPAIR Bilateral 09/22/2016   Procedure: FLEXOR TENDON  REPAIR;  Surgeon: Betha LoaKuzma, Kevin, MD;  [facility];  Service: Orthopedics;  Laterality: Bilateral;  . I&D EXTREMITY Bilateral 09/22/2016   Procedure: IRRIGATION AND DEBRIDEMENT EXTREMITY;  Surgeon: Betha LoaKuzma, Kevin, MD;  Location: [facility] OR;  Service: Orthopedics;  Laterality: Bilateral;  . NERVE, TENDON AND ARTERY REPAIR N/A 09/22/2016   Procedure: BILATERAL ARM EXPLORATION POSSIBLE NERVE, TENDON AND ARTERY REPAIR AS NEEDED;  Surgeon: Betha LoaKuzma, Kevin, MD;  Location: [facility] OR;  Service: Orthopedics;  Laterality: N/A;  . REPAIR EXTENSOR TENDON Bilateral 09/22/2016   Procedure: REPAIR EXTENSOR TENDON;  Surgeon: Betha LoaKuzma, Kevin, MD;  Location: [facility] OR;  Service: Orthopedics;  Laterality: Bilateral;   Family History: History reviewed. No pertinent family history. Family Psychiatric  History: As in H&P  Social History:  History  Alcohol Use  . 2.4 oz/week  . 4 Cans of beer per week     History  Drug Use No    Social History   Social History  . Marital status: Divorced    Spouse name: N/A  . Number of children: N/A  . Years of education: N/A   Social History Main Topics  . Smoking status: Current Every Day Smoker  . Smokeless tobacco: Never Used  . Alcohol use 2.4 oz/week    4 Cans of beer per week  . Drug use: No  . Sexual activity: Not Currently   Other Topics Concern  . None   Social History Narrative  . None   Additional Social History:        Sleep:  Good  Appetite:  Good  Current Medications: Current Facility-Administered Medications  Medication Dose Route Frequency Provider Last Rate Last Dose  . acetaminophen (TYLENOL) tablet 650 mg  650 mg Oral Q6H PRN Okonkwo, Justina A, NP   650 mg at 09/24/16 2108  . alum & mag hydroxide-simeth (MAALOX/MYLANTA) 200-200-20 MG/5ML suspension 30 mL  30 mL Oral Q4H PRN Okonkwo, Justina A, NP      . ibuprofen (ADVIL,MOTRIN) tablet 600 mg  600 mg Oral Q6H PRN Denzil Magnuson, NP   600 mg at 09/25/16 1649  . magnesium hydroxide (MILK OF MAGNESIA)  suspension 30 mL  30 mL Oral Daily PRN Okonkwo, Justina A, NP   30 mL at 09/25/16 0831  . multivitamin with minerals tablet 1 tablet  1 tablet Oral Daily Nira Conn A, NP   1 tablet at 09/28/16 0823  . nicotine (NICODERM CQ - dosed in mg/24 hours) patch 21 mg  21 mg Transdermal Daily Jomarie Longs, MD   21 mg at 09/28/16 0823  . sertraline (ZOLOFT) tablet 50 mg  50 mg Oral Daily Money, Gerlene Burdock, FNP   50 mg at 09/28/16 1610  . sulfamethoxazole-trimethoprim (BACTRIM DS,SEPTRA DS) 800-160 MG per tablet 1 tablet  1 tablet Oral Q12H Leata Mouse, MD   1 tablet at 09/28/16 0823  . thiamine (VITAMIN B-1) tablet 100 mg  100 mg Oral Daily Nira Conn A, NP   100 mg at 09/28/16 9604  . traZODone (DESYREL) tablet 50 mg  50 mg Oral QHS PRN Beryle Lathe, Justina A, NP   50 mg at 09/27/16 2206    Lab Results:  Results for orders placed or performed during the hospital encounter of 09/23/16 (from the past 48 hour(s))  Glucose, capillary     Status: None   Collection Time: 09/26/16  5:14 PM  Result Value Ref Range   Glucose-Capillary 94 65 - 99 mg/dL   Comment 1 Notify RN    Comment 2 Document in Chart     Blood Alcohol level:  Lab Results  Component Value Date   ETH <5 09/22/2016    Metabolic Disorder Labs: No results found for: HGBA1C, MPG No results found for: PROLACTIN No results found for: CHOL, TRIG, HDL, CHOLHDL, VLDL, LDLCALC  Physical Findings: AIMS: Facial and Oral Movements Muscles of Facial Expression: None, normal Lips and Perioral Area: None, normal Jaw: None, normal Tongue: None, normal,Extremity Movements Upper (arms, wrists, hands, fingers): None, normal Lower (legs, knees, ankles, toes): None, normal, Trunk Movements Neck, shoulders, hips: None, normal, Overall Severity Severity of abnormal movements (highest score from questions above): None, normal Incapacitation due to abnormal movements: None, normal Patient's awareness of abnormal movements (rate only  patient's report): No Awareness, Dental Status Current problems with teeth and/or dentures?: No Does patient usually wear dentures?: No  CIWA:  CIWA-Ar Total: 0 COWS:     Musculoskeletal: Strength & Muscle Tone: within normal limits Gait & Station: broad based Patient leans: N/A  Psychiatric Specialty Exam: Physical Exam  Nursing note and vitals reviewed. Constitutional: He is oriented to person, place, and time. No distress.  HENT:  Head: Atraumatic.  Eyes: Pupils are equal, round, and reactive to light.  Respiratory: Effort normal.  Musculoskeletal: Normal range of motion.  Neurological: He is alert and oriented to person, place, and time.  Skin: Skin is warm and dry. He is not diaphoretic.  Psychiatric:  As above    Review of Systems  Psychiatric/Behavioral: Positive for depression and substance abuse. Negative for hallucinations,  memory loss and suicidal ideas. The patient is nervous/anxious. The patient does not have insomnia.   All other systems reviewed and are negative.   Blood pressure 104/65, pulse (!) 108, temperature 98.4 F (36.9 C), temperature source Oral, resp. rate 16, height 5\' 10"  (1.778 m), weight 155 lb (70.3 kg), SpO2 99 %.Body mass index is 22.24 kg/m.  General Appearance:  Flushed, in hospital clothing. Both forearms are dressed. Good rapport. Appropriate behavior.   Eye Contact:  Good  Speech:  Clear and Coherent and Normal Rate  Volume:  Normal  Mood:  Euthymic  Affect:  Appropriate and Full Range  Thought Process:  Linear  Orientation:  Full (Time, Place, and Person)  Thought Content:  No delusional theme. No preoccupation with violent thoughts. No negative ruminations. No obsession.  No hallucination in any modality.   Suicidal Thoughts:  No  Homicidal Thoughts:  No  Memory:  Immediate;   Good Recent;   Good Remote;   Good  Judgement:  Good  Insight:  Fair  Psychomotor Activity:  Normal  Concentration:  Concentration: Good and Attention  Span: Good  Recall:  Good  Fund of Knowledge:  Good  Language:  Good  Akathisia:  Negative  Handed:    AIMS (if indicated):     Assets:  Communication Skills Desire for Improvement  ADL's:  Impaired  Cognition:  WNL  Sleep:  Number of Hours: 6.25     Treatment Plan Summary: Patient is detoxing smoothly. He denies depression as well as SI or homicidal ideas. There are no signs of psychosis or other  psychotic process.  He minimizes his use of alcohol. He is willing to get into rehab. [facility] 09[facility]. Will continue the following treatment plan without adjustments;   Psychiatric: AUD MDD  Medical:  Psychosocial:   PLAN: 1. Continue current medication regimen; Librium protocol for detox, Nicoderm 21 mg TD for smoking cessation, Zoloft 50 mg po daily for depression, Bactrim 1 tablet q12 hours for prevention of wound care, Trazodone 50 mg po daily at bedtime for insomnia.   2. Continue to monitor mood, behavior and interaction with peers 3. SW would coordinate aftercare   Denzil MagnusonLaShunda France Lusty, NP 09/28/2016, 12:48 PM   Patient ID: Andrew GrahamPat Maldonado, male   DOB: [birth year], 61 y.o.   MRN: [id number]

## 2016-09-28 NOTE — Plan of Care (Signed)
Problem: Coping: Goal: Ability to demonstrate self-control will improve Outcome: Completed/Met Date Met: 09/28/16 Patient is calm and appropriate.
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
## 2016-09-28 NOTE — Progress Notes (Signed)
D: Patient observed up and visible in the milieu. Frequent contacts made 1:1 throughout shift. Patient verbalizes to this Clinical research associatewriter he is nervous about discharge plan now that [facility] not an option due to his need for post op follow up on his arms. Patient's affect anxious with congruent mood. Patient pleasant and cooperative. Per self inventory and discussions with writer, rates depression at a 2/10, hopelessness at a 2/10 and anxiety at a 2/10. Rates sleep as good, appetite as good, energy as normal  and concentration as good.  States goal for today is to "learn and listen." Denies pain, physical problems.   A: Medicated per orders, prn vistaril given this evening for anxiety. Level III obs in place for safety. Emotional support offered and self inventory reviewed. Encouraged completion of Suicide Safety Plan and programming participation. Discussed POC with MD, SW.   R: Patient verbalizes understanding of POC. On reassess, patient's anxiety decreased. Patient denies SI/HI/AVH and remains safe on level III obs. Will continue to monitor closely and make verbal contact frequently. Patient made aware team will re-eval his disposition in the AM.

## 2016-09-28 NOTE — Progress Notes (Signed)
Nursing Progress Note: 7p-7a D: Pt currently presents with a depressed/anxious/worried affect and behavior. Pt states "I had a bad day. I really wish these casts were not so limiting. I got bad news from home too but I Don't wanna talk about it." Interacting appropriately with milieu. Pt reports good sleep during the previous night with current medication regimen.   A: Pt provided with medications per providers orders. Pt's labs and vitals were monitored throughout the night. Pt supported emotionally and encouraged to express concerns and questions. Pt educated on medications.  R: Pt's safety ensured with 15 minute and environmental checks. Pt currently denies SI, HI, and AVH. Pt verbally contracts to seek staff if SI,HI, or AVH occurs and to consult with staff before acting on any harmful thoughts. Will continue to monitor.

## 2016-09-28 NOTE — Progress Notes (Addendum)
  [facility]  Will you be returning to the same living situation after discharge:  No. Pt has [facility] screening for admission on Thursday at 7:45AM.  At discharge, do you have transportation home?: Yes,  taxi voucher in chart-PATIENT MUST DISCHARGE NO LATER THAN 7AM ON THURSDAY FOR [facility] SCREENING Do you have the ability to pay for your medications: Yes,  mental health  Release of information consent forms completed and submitted to medical records by CSW.  Patient to Follow up at: Follow-up Information    NOT ELIGIBLE FOR [facility] DUE TO NEED FOR SURGERY ON ARM.         [facility] Follow up.   Specialty:  Behavioral Health Why:  Walk in within 7 days of hospital/rehab discharge to be assessed for outpatient mental health services including: medication management and counseling. Walk in hours: Mon-Fri 8am-9am. Thank you.  Contact information: [address] [place] [address] [phone]           Next level of care provider has [facility]:no  Safety Planning and Suicide Prevention discussed: Yes,  SPE completed with pt; pt declined to consent to family contact. SPI pamphlet and Mobile Crisis information provided to pt.   Have you used any form of tobacco in the last 30 days? (Cigarettes, Smokeless Tobacco, Cigars, and/or Pipes): Yes  Has patient been referred to the Quitline?: Patient refused referral  Patient has been referred for addiction treatment: Yes  Pulte HomesHeather N Smart, LCSW 09/28/2016, 11:02 AM

## 2016-09-28 NOTE — Progress Notes (Signed)
Recreation Therapy Notes  Date: 09/28/2016 Time: 9:30am Location: [address] Dayroom  Group Topic: Stress Management  Goal Area(s) Addresses:  Patient will verbalize importance of using healthy stress management.  Patient will identify positive emotions associated with healthy stress management.   Behavioral Response: Engaged  Intervention: Stress Management  Activity : Visual Relaxation. Recreation Therapy Intern introduced the stress management technique of visual relaxation. Recreation Therapy Intern read a script that allowed patients to take mental trip to the beach. Recreation Therapy Intern played calming beach wave sounds. Patients were to follow along as script was read to engage in the activity.  Education: Stress Management, Discharge Planning.   Education Outcome: Acknowledges edcuation  Clinical Observations/Feedback: Pt attended group.  Rachel Meyer, Recreation Therapy Intern  Nylan Nakatani, LRT/CTRS 

## 2016-09-29 NOTE — [facility] Group Notes (Signed)
[facility] Group Notes:  (Nursing/MHT/Case Management/Adjunct)  Date:  09/29/2016  Time:  0930  Type of Therapy:  Nurse Education  Participation Level:  Did Not Attend  Participation Quality:    Affect:    Cognitive:    Insight:    Engagement in Group:    Modes of Intervention:    Summary of Progress/Problems: Patient invited however elected not to attend group.  STRICKFriedman, Serena Petterson [facility] 09/29/2016, 1000

## 2016-09-29 NOTE — Care Management Note (Signed)
Surgical follow up appointment scheduled with orthopedic surgeon, Dr. Kevin Kuzma, on Wednesday 10/05/16 at 1:45pm. See discharge instructions.  

## 2016-09-29 NOTE — [facility] Group Notes (Deleted)
[facility] Group Notes:  (Nursing/MHT/Case Management/Adjunct)  Date:  09/29/2016  Time:  0930  Type of Therapy:  Nurse Education  Participation Level:  Did Not Attend  Participation Quality:    Affect:    Cognitive:    Insight:    Engagement in Group:    Modes of Intervention:    Summary of Progress/Problems: Patient invited to group however elected to remain in bed.  THEURERFriedman, Bless Belshe Eakes 09/29/2016, 5:48 PM
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
## 2016-09-29 NOTE — Care Management Note (Signed)
Surgical follow up appointment scheduled with orthopedic surgeon, Dr. Betha LoaKevin Kuzma, on Wednesday 10/05/16 at 1:45pm. See discharge instructions.

## 2016-09-29 NOTE — [facility] Group Notes (Signed)
[facility] LCSW Group Therapy Note  Date/Time: 09/29/2016 at 1:15pm  Type of Therapy/Topic:  Group Therapy:  Balance in Life  Participation Level: Active  Description of Group:    This group will address the concept of balance and how it feels and looks when one is unbalanced. Patients will be encouraged to process areas in their lives that are out of balance, and identify reasons for remaining unbalanced. Facilitators will guide patients utilizing problem- solving interventions to address and correct the stressor making their life unbalanced. Understanding and applying boundaries will be explored and addressed for obtaining  and maintaining a balanced life. Patients will be encouraged to explore ways to assertively make their unbalanced needs known to significant others in their lives, using other group members and facilitator for support and feedback.  Therapeutic Goals: 1. Patient will identify two or more emotions or situations they have that consume much of in their lives. 2. Patient will identify signs/triggers that life has become out of balance:  3. Patient will identify two ways to set boundaries in order to achieve balance in their lives:  4. Patient will demonstrate ability to communicate their needs through discussion and/or role plays  Summary of Patient Progress: Patient actively participated in group on today. Patient was able to identify areas he felt were out of balance and identify reasons why. Patient was also able to identify ways he could set boundaries in order to achieve a more balanced life. Andrew Maldonado reported he would like to get himself together so that he can visit his grandkids.  Patient interacted positively with his peers and was receptive to feedback provided by CSW.    Therapeutic Modalities:   Cognitive Behavioral Therapy Solution-Focused Therapy Assertiveness Training   Andrew Maldonado, [place] Clinical Social Worker [facility] Ph: [phone]

## 2016-09-29 NOTE — Progress Notes (Deleted)
  [facility]  Will you be returning to the same living situation after discharge:  No. Pt has [facility] screening for admission on Thursday at 7:45AM.  At discharge, do you have transportation home?: Yes,  taxi voucher in chart-PATIENT MUST DISCHARGE NO LATER THAN 7AM ON THURSDAY FOR [facility] SCREENING Do you have the ability to pay for your medications: Yes,  mental health  Release of information consent forms completed and submitted to medical records by CSW.  Patient to Follow up at: Follow-up Information    NOT ELIGIBLE FOR [facility] DUE TO NEED FOR SURGERY ON ARM.         [facility] up.   Specialty:  Behavioral Health Why:  Walk in within 7 days of hospital/rehab discharge to be assessed for outpatient mental health services including: medication management and counseling. Walk in hours: Mon-Fri 8am-9am. Thank you.  Contact information: [address] [place] [address] [phone]           Next level of care provider has access to [facility] Link:no  Safety Planning and Suicide Prevention discussed: Yes,  SPE completed with pt; pt declined to consent to family contact. SPI pamphlet and Mobile Crisis information provided to pt.   Have you used any form of tobacco in the last 30 days? (Cigarettes, Smokeless Tobacco, Cigars, and/or Pipes): Yes  Has patient been referred to the Quitline?: Patient refused referral  Patient has been referred for addiction treatment: Yes  SEVERTnne C Jesua Tamblyn, LCSW 09/29/2016, 8:56 AM

## 2016-09-29 NOTE — Progress Notes (Signed)
PT NOTE:  [facility]  Pt is mobilizing IND without assistive device and demonstrating good stability with no LOB.  Pt reports no difficulty with mobility since admission and reports he has been performing basic ADLs including showering unassisted.  Pt screened only with no PT needs identified at this time.  PT services to be dc at this time.
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
## 2016-09-29 NOTE — Progress Notes (Addendum)
Spoke with OT Gaylyn LambertMary Ellen (pager [phone]) who states surgeon's post op note does not identify whether patient has any restrictions on his activity. Plan cannot be developed at this time without direction from surgeon. Izediuno, MD updated who states he will attempt to make contact with Dr. Merlyn LotKuzma in the AM. Disposition uncertain at present. Patient remains anxious and distressed regarding current status.

## 2016-09-29 NOTE — Progress Notes (Deleted)
  [facility] Discharge [facility] :  Will you be returning to the same living situation after discharge:  No. Pt has daymark screening for admission on Thursday at 7:45AM.  At discharge, do you have transportation home?: Yes,  taxi voucher in chart-PATIENT MUST DISCHARGE NO LATER THAN 7AM ON THURSDAY FOR [facility] SCREENING Do you have the ability to pay for your medications: Yes,  mental health  Release of information consent forms completed and submitted to medical records by CSW.  Patient to Follow up at: Follow-up Information    NOT ELIGIBLE FOR [facility] DUE TO NEED FOR SURGERY ON ARM.         [facility].   Specialty:  Behavioral Health Why:  Walk in within 7 days of hospital/rehab discharge to be assessed for outpatient mental health services including: medication management and counseling. Walk in hours: Mon-Fri 8am-9am. Thank you.  Contact information: [address] [place] [address] [phone]           Next level of care provider has access to [facility] Link:no  Safety Planning and Suicide Prevention discussed: Yes,  SPE completed with pt; pt declined to consent to family contact. SPI pamphlet and Mobile Crisis information provided to pt.   Have you used any form of tobacco in the last 30 days? (Cigarettes, Smokeless Tobacco, Cigars, and/or Pipes): Yes  Has patient been referred to the Quitline?: Patient refused referral  Patient has been referred for addiction treatment: Yes  Andrew C Kenzy Campoverde, LCSW 09/29/2016, 8:57 AM

## 2016-09-29 NOTE — Plan of Care (Signed)
Problem: Education: Goal: Verbalization of understanding the information provided will improve Outcome: Progressing Patient verbalizes understanding of information, education provided.  Problem: Medication: Goal: Compliance with prescribed medication regimen will improve Outcome: Completed/Met Date Met: 09/29/16 Patient remains compliant with medications. Expresses need for prns when appropriate.   

## 2016-09-29 NOTE — Progress Notes (Signed)
Nursing Progress Note: 7p-7a D: Pt currently presents with a flat/sad/anxious affect and behavior. Pt states "I had a fifty fifty day. It was good and bad. Just concerned about the uncertainty of what is going to happen to me. I wish I could just leave, but again where would I go?." Interacting appropriately with milieu. Pt reports good sleep during the previous night with current medication regimen.   A: Pt provided with medications per providers orders. Pt's labs and vitals were monitored throughout the night. Pt supported emotionally and encouraged to express concerns and questions. Pt educated on medications.  R: Pt's safety ensured with 15 minute and environmental checks. Pt currently denies SI, HI, and AVH. Pt verbally contracts to seek staff if SI,HI, or AVH occurs and to consult with staff before acting on any harmful thoughts. Will continue to monitor.

## 2016-09-29 NOTE — Social Work (Signed)
RN CM Crystal Jon BillingsMorrison and Onnie BoerJennifer Clark notified that patient needs surgical follow up after discharge.  Santa GeneraAnne Beckham Buxbaum, LCSW Lead Clinical Social Worker Phone:  [phone]

## 2016-09-29 NOTE — Progress Notes (Signed)
D: Patient observed resting in bed much of day however patient does attend meals and did attend afternoon SW group. Frequent contacts made 1:1 throughout shift. Patient verbalizes to this Clinical research associatewriter he is anxious about his discharge plan. "Where will I go? How will I get to my appointments?" Patient's affect flat, anxious and worried with congruent mood. Per self inventory and discussions with writer, rates depression, hopelessness and anxiety all at a 6/10. Rates sleep as fair, appetite as fair, energy as normal and concentration as poor.  States goal for today is to "learn, listen". Denies pain, physical problems.   A: Medicated per orders, no prns requested, required. Level III obs in place for safety. Emotional support offered and self inventory reviewed. Encouraged completion of Suicide Safety Plan and programming participation. Discussed POC with MD, SW.   R: Patient verbalizes understanding of POC. Patient denies SI/HI/AVH and remains safe on level III obs. Will continue to monitor closely and make verbal contact frequently.

## 2016-09-30 MED ORDER — SERTRALINE HCL 100 MG PO TABS
100.0000 mg | ORAL_TABLET | Freq: Every day | ORAL | Status: DC
  Administered 2016-10-01 – 2016-10-05 (×5): 100 mg via ORAL
  Filled 2016-09-30 (×7): qty 1

## 2016-09-30 MED ORDER — TUBERCULIN PPD 5 UNIT/0.1ML ID SOLN
5.0000 [IU] | Freq: Once | INTRADERMAL | Status: AC
  Administered 2016-09-30: 5 [IU] via INTRADERMAL

## 2016-09-30 NOTE — Social Work (Signed)
PASARR submitted.  Patient packet referred to following family care homes, awaiting response.  Patient has no payor source for placement.  If accepted will need assistance monthly fees for placement. Faxed out to the follow facilities: [facility] - administrator will come [facility] to assess patient on Monday from 12 - 1 Dor[facility] Mango([facility]) [facility]  Awaiting responses from above facilities.  Santa GeneraAnne Cunningham, LCSW Lead Clinical Social Worker Phone:  [phone]

## 2016-09-30 NOTE — [place] FL2 (Signed)
[place] MEDICAID FL2 LEVEL OF CARE SCREENING TOOL     IDENTIFICATION  Patient Name: Andrew Maldonado Birthdate: [birth year] Sex: male Admission Date (Current Location): 09/23/2016  [facility] [place]   Facility and Address:   [facility], [address], [place]  [address])      Provider Number: [id number]  Attending Physician Name and Address:  Craige Cottaobos, Fernando A, MD  Relative Name and Phone Number:       Current Level of Care: Hospital Recommended Level of [facility]: [facility] Prior Approval Number:    Date Approved/Denied:   PASRR Number: pending  Discharge Plan: Domiciliary (Rest home)    Current Diagnoses: Patient Active Problem List   Diagnosis Date Noted  . ETOH abuse 09/26/2016  . MDD (major depressive disorder) 09/23/2016    Orientation RESPIRATION BLADDER Height & Weight     Time, Self, Situation, Place  Normal Continent Weight: 155 lb (70.3 kg) Height:  5\' 10"  (177.8 cm)  BEHAVIORAL SYMPTOMS/MOOD NEUROLOGICAL BOWEL NUTRITION STATUS      Continent Diet (Regular diet)  AMBULATORY STATUS COMMUNICATION OF NEEDS Skin   Independent Verbally Normal                       Personal Care Assistance Level of Assistance  Bathing, Feeding, Dressing Bathing Assistance: Independent Feeding assistance: Independent Dressing Assistance: Independent     Functional Limitations Info  Sight, Hearing, Speech Sight Info: Adequate Hearing Info: Adequate Speech Info: Adequate    SPECIAL CARE FACTORS FREQUENCY  PT (By licensed PT) (surgical wounds on both forearms, reeiving outpatient follow up)     PT Frequency: 2x/week as determined by surgical follow up              Contractures Contractures Info: Not present    Additional Factors Info  Psychotropic     Psychotropic Info: Trazodone         Current Medications (09/30/2016):  This is the current hospital active medication list Current  Facility-Administered Medications  Medication Dose Route Frequency Provider Last Rate Last Dose  . acetaminophen (TYLENOL) tablet 650 mg  650 mg Oral Q6H PRN Okonkwo, Justina A, NP   650 mg at 09/24/16 2108  . alum & mag hydroxide-simeth (MAALOX/MYLANTA) 200-200-20 MG/5ML suspension 30 mL  30 mL Oral Q4H PRN Okonkwo, Justina A, NP      . hydrOXYzine (ATARAX/VISTARIL) tablet 25 mg  25 mg Oral Q6H PRN Izediuno, Delight OvensVincent A, MD   25 mg at 09/28/16 1811  . ibuprofen (ADVIL,MOTRIN) tablet 600 mg  600 mg Oral Q6H PRN Denzil Magnusonhomas, Lashunda, NP   600 mg at 09/25/16 1649  . magnesium hydroxide (MILK OF MAGNESIA) suspension 30 mL  30 mL Oral Daily PRN Okonkwo, Justina A, NP   30 mL at 09/25/16 0831  . multivitamin with minerals tablet 1 tablet  1 tablet Oral Daily Nira ConnBerry, Jason A, NP   1 tablet at 09/30/16 0813  . nicotine (NICODERM CQ - dosed in mg/24 hours) patch 21 mg  21 mg Transdermal Daily Eappen, Levin BaconSaramma, MD   21 mg at 09/30/16 0815  . sertraline (ZOLOFT) tablet 50 mg  50 mg Oral Daily Money, MOZERravis B, FNP   50 mg at 09/30/16 0813  . sulfamethoxazole-trimethoprim (BACTRIM DS,SEPTRA DS) 800-160 MG per tablet 1 tablet  1 tablet Oral Q12H Leata MouseJonnalagadda, Janardhana, MD   1 tablet at 09/30/16 0813  . thiamine (VITAMIN B-1) tablet 100 mg  100 mg  Oral Daily Nira ConnBerry, Jason A, NP   100 mg at 09/30/16 0813  . traZODone (DESYREL) tablet 50 mg  50 mg Oral QHS PRN Beryle Lathekonkwo, Justina A, NP   50 mg at 09/29/16 2208     Discharge Medications: Please see discharge summary for a list of discharge medications.  Relevant Imaging Results:  Relevant Lab Results:   Additional Information No known allergies;   Sallee LangeAnne C Cunningham, LCSW

## 2016-09-30 NOTE — Progress Notes (Signed)
PPD placed in inner aspect of upper L arm per A Nwoko, NP instructions. (Patient has bilateral post-op dressings to forearms that cannot be removed.) Area circled with ballpoint pen and will be read in 48 hours. Lawrence MarseillesFriedman, Efrain Clauson Eakes

## 2016-09-30 NOTE — Tx Team (Signed)
Interdisciplinary Treatment and Diagnostic Plan Update  09/30/2016 Time of Session: 1000 AM BUICEat Caicedo MRN: [id number]  Principal Diagnosis: MDD, recurrent, severe   Secondary Diagnoses: Principal Problem:   MDD (major depressive disorder) Active Problems:   ETOH abuse   Current Medications:  Current Facility-Administered Medications  Medication Dose Route Frequency Provider Last Rate Last Dose  . acetaminophen (TYLENOL) tablet 650 mg  650 mg Oral Q6H PRN Okonkwo, Justina A, NP   650 mg at 09/24/16 2108  . alum & mag hydroxide-simeth (MAALOX/MYLANTA) 200-200-20 MG/5ML suspension 30 mL  30 mL Oral Q4H PRN Okonkwo, Justina A, NP      . hydrOXYzine (ATARAX/VISTARIL) tablet 25 mg  25 mg Oral Q6H PRN Izediuno, Delight OvensVincent A, MD   25 mg at 09/28/16 1811  . ibuprofen (ADVIL,MOTRIN) tablet 600 mg  600 mg Oral Q6H PRN Denzil Magnusonhomas, Lashunda, NP   600 mg at 09/25/16 1649  . magnesium hydroxide (MILK OF MAGNESIA) suspension 30 mL  30 mL Oral Daily PRN Okonkwo, Justina A, NP   30 mL at 09/25/16 0831  . multivitamin with minerals tablet 1 tablet  1 tablet Oral Daily Nira ConnBerry, Jason A, NP   1 tablet at 09/30/16 0813  . nicotine (NICODERM CQ - dosed in mg/24 hours) patch 21 mg  21 mg Transdermal Daily Eappen, Levin BaconSaramma, MD   21 mg at 09/30/16 0815  . sertraline (ZOLOFT) tablet 50 mg  50 mg Oral Daily Money, THIBAULTravis B, FNP   50 mg at 09/30/16 0813  . sulfamethoxazole-trimethoprim (BACTRIM DS,SEPTRA DS) 800-160 MG per tablet 1 tablet  1 tablet Oral Q12H Leata MouseJonnalagadda, Janardhana, MD   1 tablet at 09/30/16 0813  . thiamine (VITAMIN B-1) tablet 100 mg  100 mg Oral Daily Nira ConnBerry, Jason A, NP   100 mg at 09/30/16 0813  . traZODone (DESYREL) tablet 50 mg  50 mg Oral QHS PRN Beryle Lathekonkwo, Justina A, NP   50 mg at 09/29/16 2208   PTA Medications: Prescriptions Prior to Admission  Medication Sig Dispense Refill Last Dose  . acetaminophen (TYLENOL) 325 MG tablet Take 325-650 mg by mouth every 6 (six) hours as needed (for pain or  headaches).   PRN at PRN  . Camphor-Eucalyptus-Menthol (VICKS VAPORUB) 4.7-1.2-2.6 % OINT Apply 1 application topically at bedtime as needed (under the nose for congestion).   PRN at PRN  . HYDROcodone-acetaminophen (NORCO) 5-325 MG tablet 1-2 tabs po q6 hours prn pain 20 tablet 0   . ibuprofen (ADVIL,MOTRIN) 200 MG tablet Take 200-400 mg by mouth every 6 (six) hours as needed (for pain or headaches).   PRN at PRN  . Multiple Vitamins-Minerals (ONE-A-DAY MENS HEALTH FORMULA) TABS Take 1 tablet by mouth daily with supper.   09/21/2016 at pm  . Omega-3 Fatty Acids (FISH OIL PO) Take 1 capsule by mouth daily with supper.   09/21/2016 at pm  . POTASSIUM PO Take 1 tablet by mouth daily with supper.   09/21/2016 at pm  . sulfamethoxazole-trimethoprim (BACTRIM DS) 800-160 MG tablet Take 1 tablet by mouth 2 (two) times daily. 14 tablet 0   . trolamine salicylate (ASPERCREME) 10 % cream Apply 1 application topically 2 (two) times daily as needed for muscle pain.   PRN at PRN    Patient Stressors: Financial difficulties Substance abuse  Patient Strengths: Capable of independent living General fund of knowledge Physical Health  Treatment Modalities: Medication Management, Group therapy, Case management,  1 to 1 session with clinician, Psychoeducation, Recreational therapy.   Physician Treatment Plan  for Primary Diagnosis: MDD, recurrent, severe  Long Term Goal(s): Improvement in symptoms so as ready for discharge Improvement in symptoms so as ready for discharge   Short Term Goals: Ability to identify changes in lifestyle to reduce recurrence of condition will improve Ability to verbalize feelings will improve Ability to disclose and discuss suicidal ideas Ability to demonstrate self-control will improve Ability to identify and develop effective coping behaviors will improve Ability to maintain clinical measurements within normal limits will improve Compliance with prescribed medications will  improve Ability to identify triggers associated with substance abuse/mental health issues will improve  Medication Management: Evaluate patient's response, side effects, and tolerance of medication regimen.  Therapeutic Interventions: 1 to 1 sessions, Unit Group sessions and Medication administration.  Evaluation of Outcomes: Progressing   Physician Treatment Plan for Secondary Diagnosis: Principal Problem:   MDD (major depressive disorder) Active Problems:   ETOH abuse  Long Term Goal(s): Improvement in symptoms so as ready for discharge Improvement in symptoms so as ready for discharge   Short Term Goals: Ability to identify changes in lifestyle to reduce recurrence of condition will improve Ability to verbalize feelings will improve Ability to disclose and discuss suicidal ideas Ability to demonstrate self-control will improve Ability to identify and develop effective coping behaviors will improve Ability to maintain clinical measurements within normal limits will improve Compliance with prescribed medications will improve Ability to identify triggers associated with substance abuse/mental health issues will improve     Medication Management: Evaluate patient's response, side effects, and tolerance of medication regimen.  Therapeutic Interventions: 1 to 1 sessions, Unit Group sessions and Medication administration.  Evaluation of Outcomes: Progressing   RN Treatment Plan for Primary Diagnosis: MDD, recurrent, severe  Long Term Goal(s): Knowledge of disease and therapeutic regimen to maintain health will improve  Short Term Goals: Ability to remain free from injury will improve, Ability to verbalize feelings will improve and Ability to disclose and discuss suicidal ideas  Medication Management: RN will administer medications as ordered by provider, will assess and evaluate patient's response and provide education to patient for prescribed medication. RN will report any adverse  and/or side effects to prescribing provider.  Therapeutic Interventions: 1 on 1 counseling sessions, Psychoeducation, Medication administration, Evaluate responses to treatment, Monitor vital signs and CBGs as ordered, Perform/monitor CIWA, COWS, AIMS and Fall Risk screenings as ordered, Perform wound care treatments as ordered.  Evaluation of Outcomes: Progressing   LCSW Treatment Plan for Primary Diagnosis: MDD, recurrent, severe  Long Term Goal(s): Safe transition to appropriate next level of care at discharge, Engage patient in therapeutic group addressing interpersonal concerns.  Short Term Goals: Engage patient in aftercare planning with referrals and resources, Facilitate patient progression through stages of change regarding substance use diagnoses and concerns and Identify triggers associated with mental health/substance abuse issues  Therapeutic Interventions: Assess for all discharge needs, 1 to 1 time with Social worker, Explore available resources and support systems, Assess for adequacy in community support network, Educate family and significant other(s) on suicide prevention, Complete Psychosocial Assessment, Interpersonal group therapy.  Evaluation of Outcomes: Progressing    Progress in Treatment: Attending groups: Yes. Participating in groups: Yes. Taking medication as prescribed: Yes. Toleration medication: Yes. Family/Significant other contact made: SPE completed with pt; patients daughter contacted  Patient understands diagnosis: Yes. Discussing patient identified problems/goals with staff: Yes. Medical problems stabilized or resolved: Needs wound care and surgical follow up Denies suicidal/homicidal ideation: Yes. Issues/concerns per patient self-inventory: No. Other: n/a  New problem(s) identified: Patient needs surgical follow up, cannot be admitted to [facility] w current medical issues, needs outpatient stability for continued recovery, referral in place for  [facility] and possible short term family care home placement  New Short Term/Long Term Goal(s): elimination of SI thoughts; medication management for detox/mood stabilization; development of comprehensive mental wellness/sobriety plan.   Discharge Plan or Barriers: [facility] screening for admission scheduled for Thursday, 09/29/16; [facility] for outpatient mental health services. [facility] pamphlet for additional community support/resources.  09/30/16:  Patient declined at [facility] due to need for surgical follow up and wound care; CSW working w patient to identify additional psychosocial support issues to help resolve homelessness issues, will be referred to [facility] for possible short term placement in assisted living or family care home; MD contacting daugther to assess family support; aftercare w surgeon arranged, patient to be linked w Transitional Care Team at [facility] for additional support in community; MD continues to adjust medications and nursing continues to work on coping skills/emotion regulation  Reason for Continuation of Hospitalization: medication management; risk for suicide due to impulsiveness, hopelessness, lack of social support, poor coping strategies  Estimated Length of Stay: 5 days; possible discharge 10/04/16  Attendees: Patient: Andrew Maldonado 09/30/2016 11:29 AM  Physician: Dr. Jackquline Berlin MD 09/30/2016 11:29 AM  Nursing: Erskine Squibb RN; Armando Reichert RN 09/30/2016 11:29 AM  RN Care Manager:  09/30/2016 11:29 AM  Social Worker: Toribio Harbour, LCSW 09/30/2016 11:29 AM  Recreational Therapist: x 09/30/2016 11:29 AM  Other: T 09/30/2016 11:29 AM  Other:  09/30/2016 11:29 AM  Other: 09/30/2016 11:29 AM    Scribe for Treatment Team: Sallee Lange, LCSW 09/30/2016 11:29 AM

## 2016-09-30 NOTE — Plan of Care (Signed)
Problem: Safety: Goal: Periods of time without injury will increase Outcome: Completed/Met Date Met: 09/30/16 Patient has not engaged in self harm. In tx team, denies thoughts to do so, no SI.  Problem: Education: Goal: Ability to make informed decisions regarding treatment will improve Outcome: Progressing Patient verbalizes in treatment team willingness to formulate discharge plan and meet with daughter today.
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
## 2016-09-30 NOTE — Progress Notes (Signed)
D: Patient observed in bed initially this AM, has been slightly more visible this afternoon. Did "wash up" (unable to fully shower) as daughter is visiting and patient wants to look presentable. Frequent contacts made 1:1 throughout shift. Patient verbalizes to this writer his ongoing worry and concern about how he will care for his wounds, make appts, etc. Patient's affect flat, anxious and worried with congruent mood. Per self inventory and discussions with writer, rates depression, hopelessness and anxiety all at a 6/10. Rates sleep as poor, appetite as good, energy as normal and concentration as good.  States goal for today is to "learn from others." Denies pain, physical problems.   A: Medicated per orders, no prns requested or required. Level III obs in place for safety. Emotional support offered and self inventory reviewed. Encouraged completion of Suicide Safety Plan and programming participation. Discussed POC with MD, SW. Treatment team completed with patient and goals identified.  R: Patient verbalizes understanding of POC. Patient denies SI/HI/AVH and remains safe on level III obs. Will continue to monitor closely and make verbal contact frequently. Currently visiting with daughter.

## 2016-09-30 NOTE — Progress Notes (Signed)
Recreation Therapy Notes   Date: 09/30/2016 Time: 9:30am Location: [address] Dayroom  Group Topic: Stress Management  Goal Area(s) Addresses:  Patient will verbalize importance of using healthy stress management.  Patient will identify positive emotions associated with healthy stress management.   Intervention: Stress Management  Activity :  Starry Night Visualization Relaxation. Recreation Therapy Intern introduced the stress management technique of visual relaxation . Recreation Therapy Intern read a script that allowed patients to take imagine being underneath a starry night sky. Recreation Therapy Intern played forest night time sounds. Patients were to follow along as script was read to engage in the activity.  Education: Stress Management, Discharge Planning.   Education Outcome: Needs additional edcuation  Clinical Observations/Feedback: Pt did not attend group.  Rachel Meyer, Recreation Therapy Intern   Julliana Whitmyer, LRT/CTRS 
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
## 2016-10-01 NOTE — Progress Notes (Signed)
The patient attended the A.A.meeting and was appropriate.  

## 2016-10-01 NOTE — [facility] Group Notes (Signed)
[facility] Group Notes:  (Nursing/MHT/Case Management/Adjunct)  Date:  10/01/2016  Time:  2:15 PM  Type of Therapy:  Nurse Education  Participation Level:  Active  Participation Quality:  Appropriate  Affect:  Appropriate  Cognitive:  Alert and Oriented  Insight:  Appropriate  Engagement in Group:  Appropriate  Modes of Intervention:  Discussion and Support  Rhyen Mazariego M Ernest Orr 10/01/2016, 2:15 PM 

## 2016-10-01 NOTE — [facility] Suicide Risk Assessment (Signed)
[facility] INPATIENT:  Family/Significant Other Suicide Prevention Education  Suicide Prevention Education:  Education Completed; Andrew Maldonado, Andrew Maldonado, [phone],  (name of family member/significant other) has been identified by the patient as the family member/significant other with whom the patient will be residing, and identified as the person(s) who will aid the patient in the event of a mental health crisis (suicidal ideations/suicide attempt).  With written consent from the patient, the family member/significant other has been provided the following suicide prevention education, prior to the and/or following the discharge of the patient.  The suicide prevention education provided includes the following:  Suicide risk factors  Suicide prevention and interventions  National Suicide Hotline telephone number  [facility] assessment telephone number  [facility] Assistance 911  [facility] and/or Residential Mobile Crisis Unit telephone number  Request made of family/significant other to:  Remove weapons (e.g., guns, rifles, knives), all items previously/currently identified as safety concern.    Remove drugs/medications (over-the-counter, prescriptions, illicit drugs), all items previously/currently identified as a safety concern.  The family member/significant other verbalizes understanding of the suicide prevention education information provided.  The family member/significant other agrees to remove the items of safety concern listed above.  Andrew Maldonado states that they are considering having Andrew Maldonado discharge to their home; they do have weapons but will ensure these are secured prior to Andrew Maldonado's arrival at the home.  Per Andrew Maldonado "I am not sure he's glad he's here"; wondering if Andrew Maldonado remains suicidal.  Aware that Andrew Maldonado will need both mental health and substance abuse services at discharge.  Andrew Maldonado is counselor and aware of signs/symptoms of suicide and  aware of community resources for mental health concerns in their area.     Andrew Maldonado 10/01/2016, 11:11 AM

## 2016-10-01 NOTE — Progress Notes (Signed)
[facility] MD Progress Note  10/01/2016 2:03 PM Andrew Maldonado  MRN:  [id number] Subjective:   61 yo Caucasian male, divorced. Background history of AUD and Mood Disorder. Presented to the ER via EMS. Patient was picked up from his motel room by the police. Involuntarily committed on account of suicidal behavior. Has multiple bizarre and deep lacerations. Extract from surgical notes "He had a flat affect and was unusually unconcerned about his injuries despite multiple deep lacs. He had multiple long and deep linear lacerations involving forearms with tendon involvement, across mid abdomen, and scattered across legs. Police later stated they found a razor in his hotel room and a note suggestive of SI written to his daughter. His wounds are not consistent with any MVC pattern and I am highly suspicious of self-injury. Therefore, I completed IVC papers". UDS was positive for Benzodizepines.  Chart reviewed today. Patient discussed at team today.   Staff reports that he has been slightly more pleasant. He is tolerating his medications well. He has not voiced nay suicidal thoughts. No behavioral issues  We had a meeting with his daughter and son inlaw yesterday. Patient confessed to them that he intentionally slit himself. Stated it was a suicidal bid. Had felt hopeless and did not want to be a burden to his family. Family met with him before we came in. His daughter wants to be fully involved in supporting him. They plan to come on Wednesday at take him to see his Surgeon. They hope to go home with him on same day. Their goal is to get him into addiction rehab in their locality.   Seen today. Pleasant and glad his daughter is involved. Has agreed for his son to get involved too. Says he would stay voluntarily and does not want to be committed involuntarily. Denies any current thoughts of suicide. No pain or limitation on his limb.    Principal Problem: MDD (major depressive disorder) Diagnosis:   Patient Active  Problem List   Diagnosis Date Noted  . ETOH abuse [F10.10] 09/26/2016  . MDD (major depressive disorder) [F32.9] 09/23/2016   Total Time spent with patient: 20 minutes  Past Psychiatric History: As in H&P  Past Medical History: History reviewed. No pertinent past medical history.  Past Surgical History:  Procedure Laterality Date  . FLEXOR TENDON REPAIR Bilateral 09/22/2016   Procedure: FLEXOR TENDON REPAIR;  Surgeon: Leanora Cover, MD;  Location: [place];  Service: Orthopedics;  Laterality: Bilateral;  . I&D EXTREMITY Bilateral 09/22/2016   Procedure: IRRIGATION AND DEBRIDEMENT EXTREMITY;  Surgeon: Leanora Cover, MD;  Location: [place];  Service: Orthopedics;  Laterality: Bilateral;  . NERVE, TENDON AND ARTERY REPAIR N/A 09/22/2016   Procedure: BILATERAL ARM EXPLORATION POSSIBLE NERVE, TENDON AND ARTERY REPAIR AS NEEDED;  Surgeon: Leanora Cover, MD;  Location: [place];  Service: Orthopedics;  Laterality: N/A;  . REPAIR EXTENSOR TENDON Bilateral 09/22/2016   Procedure: REPAIR EXTENSOR TENDON;  Surgeon: Leanora Cover, MD;  Location: [place];  Service: Orthopedics;  Laterality: Bilateral;   Family History: History reviewed. No pertinent family history. Family Psychiatric  History: As in H&P  Social History:  History  Alcohol Use  . 2.4 oz/week  . 4 Cans of beer per week     History  Drug Use No    Social History   Social History  . Marital status: Divorced    Spouse name: N/A  . Number of children: N/A  . Years of education: N/A   Social History Main Topics  .  Smoking status: Current Every Day Smoker  . Smokeless tobacco: Never Used  . Alcohol use 2.4 oz/week    4 Cans of beer per week  . Drug use: No  . Sexual activity: Not Currently   Other Topics Concern  . None   Social History Narrative  . None   Additional Social History:        Sleep: Good  Appetite:  Good  Current Medications: Current Facility-Administered Medications  Medication Dose Route Frequency Provider  Last Rate Last Dose  . acetaminophen (TYLENOL) tablet 650 mg  650 mg Oral Q6H PRN Okonkwo, Justina A, NP   650 mg at 09/24/16 2108  . alum & mag hydroxide-simeth (MAALOX/MYLANTA) 200-200-20 MG/5ML suspension 30 mL  30 mL Oral Q4H PRN Okonkwo, Justina A, NP      . hydrOXYzine (ATARAX/VISTARIL) tablet 25 mg  25 mg Oral Q6H PRN Izediuno, Vincent A, MD   25 mg at 09/30/16 2211  . ibuprofen (ADVIL,MOTRIN) tablet 600 mg  600 mg Oral Q6H PRN Mordecai Maes, NP   600 mg at 09/25/16 1649  . magnesium hydroxide (MILK OF MAGNESIA) suspension 30 mL  30 mL Oral Daily PRN Okonkwo, Justina A, NP   30 mL at 09/25/16 0831  . multivitamin with minerals tablet 1 tablet  1 tablet Oral Daily Lindon Romp A, NP   1 tablet at 10/01/16 0802  . nicotine (NICODERM CQ - dosed in mg/24 hours) patch 21 mg  21 mg Transdermal Daily Ursula Alert, MD   21 mg at 10/01/16 0803  . sertraline (ZOLOFT) tablet 100 mg  100 mg Oral Daily Izediuno, Laruth Bouchard, MD   100 mg at 10/01/16 0801  . thiamine (VITAMIN B-1) tablet 100 mg  100 mg Oral Daily Lindon Romp A, NP   100 mg at 10/01/16 0802  . traZODone (DESYREL) tablet 50 mg  50 mg Oral QHS PRN Okonkwo, Justina A, NP   50 mg at 09/30/16 2211  . tuberculin injection 5 Units  5 Units Intradermal Once Artist Beach, MD   5 Units at 09/30/16 1606    Lab Results:  No results found for this or any previous visit (from the past 48 hour(s)).  Blood Alcohol level:  Lab Results  Component Value Date   ETH <5 73/41/9379    Metabolic Disorder Labs: No results found for: HGBA1C, MPG No results found for: PROLACTIN No results found for: CHOL, TRIG, HDL, CHOLHDL, VLDL, LDLCALC  Physical Findings: AIMS: Facial and Oral Movements Muscles of Facial Expression: None, normal Lips and Perioral Area: None, normal Jaw: None, normal Tongue: None, normal,Extremity Movements Upper (arms, wrists, hands, fingers): None, normal Lower (legs, knees, ankles, toes): None, normal, Trunk  Movements Neck, shoulders, hips: None, normal, Overall Severity Severity of abnormal movements (highest score from questions above): None, normal Incapacitation due to abnormal movements: None, normal Patient's awareness of abnormal movements (rate only patient's report): No Awareness, Dental Status Current problems with teeth and/or dentures?: No Does patient usually wear dentures?: No  CIWA:  CIWA-Ar Total: 0 COWS:     Musculoskeletal: Strength & Muscle Tone: within normal limits Gait & Station: broad based Patient leans: N/A  Psychiatric Specialty Exam: Physical Exam  Constitutional: He is oriented to person, place, and time. He appears well-developed and well-nourished.  HENT:  Head: Atraumatic.  Eyes: Pupils are equal, round, and reactive to light.  Respiratory: Effort normal.  Musculoskeletal: Normal range of motion.  Neurological: He is alert and oriented to person, place, and  time.  Skin: Skin is warm and dry.  Psychiatric:  As above    ROS  Blood pressure 108/75, pulse (!) 102, temperature 98.2 F (36.8 C), temperature source Oral, resp. rate 18, height '5\' 10"'$  (1.778 m), weight 70.3 kg (155 lb), SpO2 99 %.Body mass index is 22.24 kg/m.  General Appearance:  Calm and cooperative. Good relatedness. Appropriate behavior.   Eye Contact:  Good  Speech:  Normal rate and tone.   Volume:  Normal  Mood:  Feels better  Affect:  Full range and appropriate.   Thought Process:  Linear  Orientation:  Full (Time, Place, and Person)  Thought Content:  No delusional theme. No preoccupation with violent thoughts. No negative ruminations. No obsession.  No hallucination in any modality.   Suicidal Thoughts:  Denies any suicidal thoughts.   Homicidal Thoughts:  No  Memory:  Immediate;   Good Recent;   Good Remote;   Good  Judgement:  Good  Insight:  Fair  Psychomotor Activity:  Normal  Concentration:  Concentration: Good and Attention Span: Good  Recall:  Good  Fund of  Knowledge:  Good  Language:  Good  Akathisia:  Negative  Handed:    AIMS (if indicated):     Assets:  Communication Skills Desire for Improvement  ADL's:  Impaired  Cognition:  WNL  Sleep:  Number of Hours: 6.5     Treatment Plan Summary: Patient appears less depressed. We adjusted his antidepressant this morning. He is tolerating recent medication adjustment well. His family is now involved in his care. Patient is more hopeful. We plan to evaluate him further.     Psychiatric: AUD MDD  Medical: Multiple tendon damage  Psychosocial:  Poor support Needs further surgery and rehab.   PLAN: 1. Continue medications at current dose 2. Continue suicide precautions 3. Continue to encourage unit groups and activities.    Artist Beach, MD 10/01/2016, 2:03 PMPatient ID: Andrew Maldonado, male   DOB: 11-Mar-1955, 61 y.o.   MRN: [id number] Patient ID: Andrew Maldonado, male   DOB: [birth year], 62 y.o.   MRN: [id number] Patient ID: Andrew Maldonado, male   DOB: 1956/02/13, 61 y.o.   MRN: [id number]

## 2016-10-01 NOTE — Progress Notes (Signed)
Data. Patient denies SI/HI/AVH. Patient remained in his room throughout the day, with the exception of groups and meals. Wound dressings are CDI. Affect does not brighten with interaction. Action. Emotional support and encouragement offered. Education provided on medication, indications and side effect. Q 15 minute checks done for safety. Response. Safety on the unit maintained through 15 minute checks.  Medications taken as prescribed. Attended groups. Remained calm and appropriate through out shift.

## 2016-10-01 NOTE — Progress Notes (Signed)
Nursing Progress Note 1900-0730  D) Patient presents with flat and depressed affect/mood. Patient attended group this evening and requested medications for sleep and anxiety. Patient reported "I am feeling really anxious about everything, it started off as a 2 when I first got here, now it's a 7". Patient vague about causes of anxiety and refused to elaborate. Patient denies pain. Dressings clean, dry and intact. Patient denies SI/HI/AVH. Patient contracts for safety on the unit.  A) Emotional support given. 1:1 interaction and active listening provided. Patient medicated as prescribed. Medications and plan of care reviewed with patient. Patient verbalized understanding without further questions. Snacks and fluids provided. Opportunities for questions or concerns presented to patient. Patient encouraged to continue to work on treatment goals. Labs, vital signs and patient behavior monitored throughout shift. Patient safety maintained with q15 min safety checks. Low fall risk precautions in place and reviewed with patient; patient verbalized understanding.  R) Patient receptive to interaction with nurse. Patient remains safe on the unit at this time. Patient denies any adverse medication reactions at this time. Patient is resting in bed without complaints. Will continue to monitor.

## 2016-10-01 NOTE — Progress Notes (Signed)
Nursing Progress Note 1900-0730  D) Patient presents flat and depressed. Patient is minimal and superficial when interacting with Clinical research associatewriter but does make his needs known. Patient attended group but isolates on the unit. Patient denies SI/HI/AVH or pain. Patient contracts for safety on the unit. Patient reports sleeping well with current regimen requesting trazodone and vistaril. Bilateral forearm dressings clean, dry and intact. Wound site not assessed per dressings in place.  A) Emotional support given. 1:1 interaction and active listening provided. Patient medicated as prescribed. Medications and plan of care reviewed with patient. Patient verbalized understanding without further questions. Snacks and fluids provided. Opportunities for questions or concerns presented to patient. Patient encouraged to continue to work on treatment goals. Labs, vital signs and patient behavior monitored throughout shift. Patient safety maintained with q15 min safety checks. Low fall risk precautions in place and reviewed with patient; patient verbalized understanding.  R) Patient receptive to interaction with nurse. Patient remains safe on the unit at this time. Patient denies any adverse medication reactions at this time. Patient is resting in bed without complaints. Will continue to monitor.

## 2016-10-01 NOTE — Plan of Care (Signed)
Problem: Health Behavior/Discharge Planning: Goal: Compliance with treatment plan for underlying cause of condition will improve Outcome: Progressing Patient attending groups and taking medications as prescribed. Patient asking questions and verbalizes understanding about current treatment regimen.

## 2016-10-02 NOTE — [facility] Counselor (Signed)
Call made to patient's daughter ULINicole Mullens at [phone] to obtain her address for [place] [employer] form as understood from verbal handoff that one of requirements for patient to stay in family home while awaiting treatment would be to not smoke.  Daughter agreeable to using her address for form yet had many questions regarding multiple scenarios for father's release. CSW listened attentively and problem solved with family member although there were no clear cut answers to each scenario. .   Daughter is prepared to pick up patient following discharge Wednesday.   Carney Bernatherine C Harrill, LCSW

## 2016-10-02 NOTE — Progress Notes (Signed)
Data. Patient denies SI/HI/AVH. Patient interacting well with staff and other patients and has been spending time in the milieu. Patients affect is flat, but does brighten with interaction. Patient remains in bilateral casts and wraps to forearms and hands. Dressings CDI. Patient's VS have been somewhat elevated this shift. Frequent checking note they are elevated at one reading and WDL, at another. MD aware. Patient has expressed concern about his living situation when he leaves and [facility] He will be have multiple post surgery appointments and [facility] will not take him until those are complete.  Action. Emotional support and encouragement offered. Education provided on medication, indications and side effect. Q 15 minute checks done for safety. Response. Safety on the unit maintained through 15 minute checks.  Medications taken as prescribed. Attended groups. Remained calm and appropriate through out shift.

## 2016-10-02 NOTE — [facility] Group Notes (Signed)
[facility] Group Notes:  (Nursing/MHT/Case Management/Adjunct)  Date:  10/02/2016  Time:  2:15 PM  Type of Therapy:  Nurse Education  Participation Level:  Active  Participation Quality:  Appropriate  Affect:  Appropriate  Cognitive:  Appropriate  Insight:  Appropriate  Engagement in Group:  Improving  Modes of Intervention:  Activity  Summary of Progress/Problems:  Activity where patients receive feedback on their positive attributes and are able to identify positive attributes in others.   AMBORNenny G Daria Mcmeekin 10/02/2016, 2:15 PM

## 2016-10-02 NOTE — [facility] Group Notes (Signed)
  [facility] LCSW Group Therapy Note    10/02/2016 10:10  To 11AM   Type of Therapy and Topic: Group Therapy: Feelings Around Returning Home & Establishing a Supportive Framework and Activity to Identify signs of Improvement or Decompensation   Participation Level:  Minimal   Description of Group:  Patients first processed thoughts and feelings about up coming discharge. These included fears of upcoming changes, lack of change, new living environments, judgements and expectations from others and overall stigma of MH issues. We then discussed what is a supportive framework? What does it look like feel like and how do I discern it from and unhealthy non-supportive network? Learn how to cope when supports are not helpful and don't support you. Discuss what to do when your family/friends are not supportive.   Therapeutic Goals Addressed in Processing Group:  1. Patient will identify one healthy supportive network that they can use at discharge. 2. Patient will identify one factor of a supportive framework and how to tell it from an unhealthy network. 3. Patient able to identify one coping skill to use when they do not have positive supports from others. 4. Patient will demonstrate ability to communicate their needs through discussion and/or role plays.  Summary of Patient Progress:  Pt engaged easily during group session while present for first half of group session. As patients processed their anxiety about discharge and described healthy supports patient shared his challenge will be developing a new routine as drinking has been a major part of his life for so long. Patient feels he will also miss his alcohol too no matter how detrimental it is to his wellness.    Therapeutic Modalities: Cognitive Behavioral Therapy Brief Therapy Motivational Interviewing  Carney Bernatherine C Harrill, LCSW

## 2016-10-02 NOTE — Plan of Care (Signed)
Problem: Coping: Goal: Ability to verbalize frustrations and anger appropriately will improve Outcome: Progressing Patient has not engaged in any self-harm behaviors or aggressive behaviors to others/property during this admission.

## 2016-10-02 NOTE — Plan of Care (Signed)
Problem: Activity: Goal: Interest or engagement in activities will improve Outcome: Progressing Patient has attended most unit activity and is actively interacting with peers and staff. Goal: Sleeping patterns will improve Outcome: Progressing Patient has remained awake most of the shift.  Problem: Self-Concept: Goal: Ability to disclose and discuss suicidal ideas will improve Outcome: Progressing Patient has been able to discuss his SI feelings more this shift.

## 2016-10-02 NOTE — Progress Notes (Signed)
[facility]  10/02/2016 2:47 PM SARRat Fayad  MRN:  [id number] Subjective:   61 yo Caucasian male, divorced. Background history of AUD and Mood Disorder. Presented to the ER via EMS. Patient was picked up from his motel room by the police. Involuntarily committed on account of suicidal behavior. Has multiple bizarre and deep lacerations. Extract from surgical notes "He had a flat affect and was unusually unconcerned about his injuries despite multiple deep lacs. He had multiple long and deep linear lacerations involving forearms with tendon involvement, across mid abdomen, and scattered across legs. Police later stated they found a razor in his hotel room and a note suggestive of SI written to his daughter. His wounds are not consistent with any MVC pattern and I am highly suspicious of self-injury. Therefore, I completed IVC papers". UDS was positive for Benzodizepines.  Chart reviewed today. Patient discussed at team today.   Nursing staff reports that patient has been appropriate on the unit. Patient has been interacting well with peers. No behavioral issues. Patient has not voiced any suicidal thoughts. Patient has not been observed to be internally stimulated. Patient has been adherent with treatment recommendations. Patient has been tolerating their medication well.   Seen today. In god spirits. Says more of his family members came to visit him yesterday. Feels more hopeful. Talked about his son today. Says he called and they had a good conversation. No complications from his surgery. No evidence of psychosis or mania. No thoughts of violence.   Principal Problem: MDD (major depressive disorder) Diagnosis:   Patient Active Problem List   Diagnosis Date Noted  . ETOH abuse [F10.10] 09/26/2016  . MDD (major depressive disorder) [F32.9] 09/23/2016   Total Time spent with patient: 20 minutes  Past Psychiatric History: As in H&P  Past Medical History: History reviewed. No pertinent  past medical history.  Past Surgical History:  Procedure Laterality Date  . FLEXOR TENDON REPAIR Bilateral 09/22/2016   Procedure: FLEXOR TENDON REPAIR;  Surgeon: Betha LoaKuzma, Kevin, MD;  [facility];  Service: Orthopedics;  Laterality: Bilateral;  . I&D EXTREMITY Bilateral 09/22/2016   Procedure: IRRIGATION AND DEBRIDEMENT EXTREMITY;  Surgeon: Betha LoaKuzma, Kevin, MD;  Location: [facility] OR;  Service: Orthopedics;  Laterality: Bilateral;  . NERVE, TENDON AND ARTERY REPAIR N/A 09/22/2016   Procedure: BILATERAL ARM EXPLORATION POSSIBLE NERVE, TENDON AND ARTERY REPAIR AS NEEDED;  Surgeon: Betha LoaKuzma, Kevin, MD;  Location: [facility] OR;  Service: Orthopedics;  Laterality: N/A;  . REPAIR EXTENSOR TENDON Bilateral 09/22/2016   Procedure: REPAIR EXTENSOR TENDON;  Surgeon: Betha LoaKuzma, Kevin, MD;  Location: [facility] OR;  Service: Orthopedics;  Laterality: Bilateral;   Family History: History reviewed. No pertinent family history. Family Psychiatric  History: As in H&P  Social History:  History  Alcohol Use  . 2.4 oz/week  . 4 Cans of beer per week     History  Drug Use No    Social History   Social History  . Marital status: Divorced    Spouse name: N/A  . Number of children: N/A  . Years of education: N/A   Social History Main Topics  . Smoking status: Current Every Day Smoker  . Smokeless tobacco: Never Used  . Alcohol use 2.4 oz/week    4 Cans of beer per week  . Drug use: No  . Sexual activity: Not Currently   Other Topics Concern  . None   Social History Narrative  . None   Additional Social History:  Sleep: Good  Appetite:  Good  Current Medications: Current Facility-Administered Medications  Medication Dose Route Frequency Provider Last Rate Last Dose  . acetaminophen (TYLENOL) tablet 650 mg  650 mg Oral Q6H PRN Okonkwo, Justina A, NP   650 mg at 09/24/16 2108  . alum & mag hydroxide-simeth (MAALOX/MYLANTA) 200-200-20 MG/5ML suspension 30 mL  30 mL Oral Q4H PRN Okonkwo, Justina A, NP      .  hydrOXYzine (ATARAX/VISTARIL) tablet 25 mg  25 mg Oral Q6H PRN Izediuno, Delight OvensVincent A, MD   25 mg at 10/01/16 2250  . ibuprofen (ADVIL,MOTRIN) tablet 600 mg  600 mg Oral Q6H PRN Denzil Magnusonhomas, Lashunda, NP   600 mg at 09/25/16 1649  . magnesium hydroxide (MILK OF MAGNESIA) suspension 30 mL  30 mL Oral Daily PRN Okonkwo, Justina A, NP   30 mL at 09/25/16 0831  . multivitamin with minerals tablet 1 tablet  1 tablet Oral Daily Nira ConnBerry, Jason A, NP   1 tablet at 10/02/16 1109  . nicotine (NICODERM CQ - dosed in mg/24 hours) patch 21 mg  21 mg Transdermal Daily Jomarie LongsEappen, Saramma, MD   21 mg at 10/01/16 0803  . sertraline (ZOLOFT) tablet 100 mg  100 mg Oral Daily Izediuno, Delight OvensVincent A, MD   100 mg at 10/02/16 1109  . thiamine (VITAMIN B-1) tablet 100 mg  100 mg Oral Daily Nira ConnBerry, Jason A, NP   100 mg at 10/02/16 1109  . traZODone (DESYREL) tablet 50 mg  50 mg Oral QHS PRN Okonkwo, Justina A, NP   50 mg at 10/01/16 2250  . tuberculin injection 5 Units  5 Units Intradermal Once Georgiann CockerIzediuno, Vincent A, MD   5 Units at 09/30/16 1606    Lab Results:  No results found for this or any previous visit (from the past 48 hour(s)).  Blood Alcohol level:  Lab Results  Component Value Date   ETH <5 09/22/2016    Metabolic Disorder Labs: No results found for: HGBA1C, MPG No results found for: PROLACTIN No results found for: CHOL, TRIG, HDL, CHOLHDL, VLDL, LDLCALC  Physical Findings: AIMS: Facial and Oral Movements Muscles of Facial Expression: None, normal Lips and Perioral Area: None, normal Jaw: None, normal Tongue: None, normal,Extremity Movements Upper (arms, wrists, hands, fingers): None, normal Lower (legs, knees, ankles, toes): None, normal, Trunk Movements Neck, shoulders, hips: None, normal, Overall Severity Severity of abnormal movements (highest score from questions above): None, normal Incapacitation due to abnormal movements: None, normal Patient's awareness of abnormal movements (rate only patient's  report): No Awareness, Dental Status Current problems with teeth and/or dentures?: No Does patient usually wear dentures?: No  CIWA:  CIWA-Ar Total: 0 COWS:     Musculoskeletal: Strength & Muscle Tone: within normal limits Gait & Station: broad based Patient leans: N/A  Psychiatric Specialty Exam: Physical Exam  Constitutional: He is oriented to person, place, and time. He appears well-developed and well-nourished.  HENT:  Head: Atraumatic.  Eyes: Pupils are equal, round, and reactive to light.  Respiratory: Effort normal.  Musculoskeletal: Normal range of motion.  Neurological: He is alert and oriented to person, place, and time.  Skin: Skin is warm and dry.  Psychiatric:  As above    ROS  Blood pressure 123/88, pulse 100, temperature 98.8 F (37.1 C), temperature source Oral, resp. rate 18, height 5\' 10"  (1.778 m), weight 70.3 kg (155 lb), SpO2 99 %.Body mass index is 22.24 kg/m.  General Appearance:  Calm and cooperative. Engaging  more. Good use of humor.  Good relatedness. Appropriate behavior.   Eye Contact:  Good  Speech:  Spontaneous, normal prosody. Normal tone and rate.   Volume:  Normal  Mood:  Ethymic  Affect:  Full range and appropriate.   Thought Process:  Linear  Orientation:  Full (Time, Place, and Person)  Thought Content:  No delusional theme. No preoccupation with violent thoughts. No negative ruminations. No obsession.  No hallucination in any modality.   Suicidal Thoughts:  Denies any suicidal thoughts.   Homicidal Thoughts:  No  Memory:  Immediate;   Good Recent;   Good Remote;   Good  Judgement:  Good  Insight:  Fair  Psychomotor Activity:  Normal  Concentration:  Concentration: Good and Attention Span: Good  Recall:  Good  Fund of Knowledge:  Good  Language:  Good  Akathisia:  Negative  Handed:    AIMS (if indicated):     Assets:  Communication Skills Desire for Improvement  ADL's:  Impaired  Cognition:  WNL  Sleep:  Number of Hours: 6.5      Treatment Plan Summary: Patient is tolerating his medication adjustment well. Depression is lifting. He is no longer expressing any suicidal thoughts. We are finalizing aftercare. Hopeful discharge on Wednesday.      Psychiatric: AUD MDD  Medical: Multiple tendon damage  Psychosocial:  Poor support Needs further surgery and rehab.   PLAN: 1. Continue medications at current dose 2. Continue suicide precautions 3. Continue to encourage unit groups and activities.    Georgiann Cocker, MD 10/02/2016, 2:47 PMPatient ID: Andrew Maldonado, male   DOB: 1955-05-10, 62 y.o.   MRN: [id number] Patient ID: Schyler Counsell, male   DOB: 1955-08-07, 61 y.o.   MRN: [id number] Patient ID: Nikolos Billig, male   DOB: [birth year], 61 y.o.   MRN: [id number] Patient ID: Amrom Ore, male   DOB: 08-11-1955, 61 y.o.   MRN: [id number]

## 2016-10-03 MED ORDER — MIRTAZAPINE 15 MG PO TABS
15.0000 mg | ORAL_TABLET | Freq: Every day | ORAL | Status: DC
  Administered 2016-10-03 – 2016-10-04 (×2): 15 mg via ORAL
  Filled 2016-10-03 (×3): qty 1

## 2016-10-03 NOTE — [facility] Group Notes (Signed)
Pt attended spiritual care group on grief and loss facilitated by chaplain Burnis KingfisherMatthew Aletta Edmunds   Group opened with brief discussion and psycho-social ed around grief and loss in relationships and in relation to self - identifying life patterns, circumstances, changes that cause losses. Established group norm of speaking from own life experience. Group goal of establishing open and affirming space for members to share loss and experience with grief, normalize grief experience and provide psycho social education and grief support.    RYLANDat was present through group.  Was attentive to facilitator and other group members.  Did not engage in group discussion     [facility] / [facility] Chaplain Burnis KingfisherMatthew Verdis Bassette, MDiv

## 2016-10-03 NOTE — Progress Notes (Signed)
Recreation Therapy Notes  Date: 10/03/2016 Time: 9:30am Location: [address] Dayroom  Group Topic: Stress Management  Goal Area(s) Addresses:  Patient will verbalize importance of using healthy stress management.  Patient will identify positive emotions associated with healthy stress management.   Intervention: Stress Management  Activity :  Guided Body Scan. Recreation Therapy Intern introduced the stress management technique of guided body scans. Recreation Therapy Intern played a YouTube video that allowed patients to focus on the tension built up in each part of their body. Patients were to follow along as script was read to engage in the activity.  Education: Stress Management, Discharge Planning.   Education Outcome: Acknowledges edcuation  Clinical Observations/Feedback: Pt did not attend group.  Rachel Meyer, Recreation Therapy Intern    Guido Comp, LRT/CTRS   

## 2016-10-03 NOTE — Progress Notes (Signed)
[facility] MD Progress Note  10/03/2016 10:54 AM Andrew Maldonado  MRN:  [id number] Subjective:   61 yo Caucasian male, divorced. Background history of AUD and Mood Disorder. Presented to the ER via EMS. Patient was picked up from his motel room by the police. Involuntarily committed on account of suicidal behavior. Has multiple bizarre and deep lacerations. Extract from surgical notes "He had a flat affect and was unusually unconcerned about his injuries despite multiple deep lacs. He had multiple long and deep linear lacerations involving forearms with tendon involvement, across mid abdomen, and scattered across legs. Police later stated they found a razor in his hotel room and a note suggestive of SI written to his daughter. His wounds are not consistent with any MVC pattern and I am highly suspicious of self-injury. Therefore, I completed IVC papers". UDS was positive for Benzodizepines.  Chart reviewed today. Patient discussed at team today.   Staff reports that he has been doing well. He is interacting well with peers. He is taking his medications as prescribed. No side effects.  Not expressing any suicidal thoughts.  Seen today. Doing well. Says he did not sleep well last night. No benefit with trazodone. Says he has been fine otherwise. We discussed use of Mirtazapine. He consented to treatment after we discussed the risks and benefits. He plans to get in touch with his family later today.   Principal Problem: MDD (major depressive disorder) Diagnosis:   Patient Active Problem List   Diagnosis Date Noted  . ETOH abuse [F10.10] 09/26/2016  . MDD (major depressive disorder) [F32.9] 09/23/2016   Total Time spent with patient: 20 minutes  Past Psychiatric History: As in H&P  Past Medical History: History reviewed. No pertinent past medical history.  Past Surgical History:  Procedure Laterality Date  . FLEXOR TENDON REPAIR Bilateral 09/22/2016   Procedure: FLEXOR TENDON REPAIR;  Surgeon: Betha Loa, MD;  Location: [facility] OR;  Service: Orthopedics;  Laterality: Bilateral;  . I&D EXTREMITY Bilateral 09/22/2016   Procedure: IRRIGATION AND DEBRIDEMENT EXTREMITY;  Surgeon: Betha Loa, MD;  Location: [facility] OR;  Service: Orthopedics;  Laterality: Bilateral;  . NERVE, TENDON AND ARTERY REPAIR N/A 09/22/2016   Procedure: BILATERAL ARM EXPLORATION POSSIBLE NERVE, TENDON AND ARTERY REPAIR AS NEEDED;  Surgeon: Betha Loa, MD;  Location: [facility] OR;  Service: Orthopedics;  Laterality: N/A;  . REPAIR EXTENSOR TENDON Bilateral 09/22/2016   Procedure: REPAIR EXTENSOR TENDON;  Surgeon: Betha Loa, MD;  Location: [facility] OR;  Service: Orthopedics;  Laterality: Bilateral;   Family History: History reviewed. No pertinent family history. Family Psychiatric  History: As in H&P  Social History:  History  Alcohol Use  . 2.4 oz/week  . 4 Cans of beer per week     History  Drug Use No    Social History   Social History  . Marital status: Divorced    Spouse name: N/A  . Number of children: N/A  . Years of education: N/A   Social History Main Topics  . Smoking status: Current Every Day Smoker  . Smokeless tobacco: Never Used  . Alcohol use 2.4 oz/week    4 Cans of beer per week  . Drug use: No  . Sexual activity: Not Currently   Other Topics Concern  . None   Social History Narrative  . None   Additional Social History:        Sleep: Good  Appetite:  Good  Current Medications: Current Facility-Administered Medications  Medication Dose Route Frequency Provider Last  Rate Last Dose  . acetaminophen (TYLENOL) tablet 650 mg  650 mg Oral Q6H PRN Okonkwo, Justina A, NP   650 mg at 09/24/16 2108  . alum & mag hydroxide-simeth (MAALOX/MYLANTA) 200-200-20 MG/5ML suspension 30 mL  30 mL Oral Q4H PRN Okonkwo, Justina A, NP      . hydrOXYzine (ATARAX/VISTARIL) tablet 25 mg  25 mg Oral Q6H PRN Izediuno, Delight Ovens, MD   25 mg at 10/02/16 2225  . ibuprofen (ADVIL,MOTRIN) tablet 600 mg  600 mg Oral Q6H PRN  Denzil Magnuson, NP   600 mg at 09/25/16 1649  . magnesium hydroxide (MILK OF MAGNESIA) suspension 30 mL  30 mL Oral Daily PRN Okonkwo, Justina A, NP   30 mL at 09/25/16 0831  . multivitamin with minerals tablet 1 tablet  1 tablet Oral Daily Nira Conn A, NP   1 tablet at 10/02/16 1109  . nicotine (NICODERM CQ - dosed in mg/24 hours) patch 21 mg  21 mg Transdermal Daily Jomarie Longs, MD   21 mg at 10/01/16 0803  . sertraline (ZOLOFT) tablet 100 mg  100 mg Oral Daily Izediuno, Delight Ovens, MD   100 mg at 10/02/16 1109  . thiamine (VITAMIN B-1) tablet 100 mg  100 mg Oral Daily Nira Conn A, NP   100 mg at 10/02/16 1109  . traZODone (DESYREL) tablet 50 mg  50 mg Oral QHS PRN Beryle Lathe, Justina A, NP   50 mg at 10/02/16 2225    Lab Results:  No results found for this or any previous visit (from the past 48 hour(s)).  Blood Alcohol level:  Lab Results  Component Value Date   ETH <5 09/22/2016    Metabolic Disorder Labs: No results found for: HGBA1C, MPG No results found for: PROLACTIN No results found for: CHOL, TRIG, HDL, CHOLHDL, VLDL, LDLCALC  Physical Findings: AIMS: Facial and Oral Movements Muscles of Facial Expression: None, normal Lips and Perioral Area: None, normal Jaw: None, normal Tongue: None, normal,Extremity Movements Upper (arms, wrists, hands, fingers): None, normal Lower (legs, knees, ankles, toes): None, normal, Trunk Movements Neck, shoulders, hips: None, normal, Overall Severity Severity of abnormal movements (highest score from questions above): None, normal Incapacitation due to abnormal movements: None, normal Patient's awareness of abnormal movements (rate only patient's report): No Awareness, Dental Status Current problems with teeth and/or dentures?: No Does patient usually wear dentures?: No  CIWA:  CIWA-Ar Total: 0 COWS:     Musculoskeletal: Strength & Muscle Tone: within normal limits Gait & Station: broad based Patient leans: N/A  Psychiatric  Specialty Exam: Physical Exam  Constitutional: He is oriented to person, place, and time. He appears well-developed and well-nourished.  HENT:  Head: Atraumatic.  Eyes: Pupils are equal, round, and reactive to light.  Respiratory: Effort normal.  Musculoskeletal: Normal range of motion.  Neurological: He is alert and oriented to person, place, and time.  Skin: Skin is warm and dry.  Psychiatric:  As above    ROS  Blood pressure 112/83, pulse (!) 113, temperature 98.4 F (36.9 C), temperature source Oral, resp. rate 20, height 5\' 10"  (1.778 m), weight 70.3 kg (155 lb), SpO2 99 %.Body mass index is 22.24 kg/m.  General Appearance:  Calm and cooperative. Good relatedness. Appropriate behavior.   Eye Contact:  Good  Speech:  Spontaneous, normal prosody. Normal tone and rate.   Volume:  Normal  Mood:  Ethymic  Affect:  Full range and appropriate.   Thought Process:  Linear  Orientation:  Full (Time,  Place, and Person)  Thought Content:  No delusional theme. No preoccupation with violent thoughts. No negative ruminations. No obsession.  No hallucination in any modality.   Suicidal Thoughts:  None.   Homicidal Thoughts:  No  Memory:  Immediate;   Good Recent;   Good Remote;   Good  Judgement:  Good  Insight:  Good  Psychomotor Activity:  Normal  Concentration:  Concentration: Good and Attention Span: Good  Recall:  Good  Fund of Knowledge:  Good  Language:  Good  Akathisia:  Negative  Handed:    AIMS (if indicated):     Assets:  Communication Skills Desire for Improvement  ADL's:  Impaired  Cognition:  WNL  Sleep:  Number of Hours: 6.5     Treatment Plan Summary: Depression is improving. Residual insomnia would be targeted with Mirtazapine. Hopeful discharge on Wednesday     Psychiatric: AUD MDD  Medical: Multiple tendon damage  Psychosocial:  Poor support Needs further surgery and rehab.   PLAN: 1. Mirtazapine 15 mg HS for depression and insomnia 2. Continue  to monitor mod, behavior and interaction with peers.  3. Continue to encourage unit groups and activities.    Georgiann CockerVincent A Izediuno, MD 10/03/2016, 10:54 AMPatient ID: Clementeen GrahamPat Agnes, male   DOB: [birth year], 61 y.o.   MRN: [id number] Patient ID: SADOWSKIat Power, male   DOB: [birth year], 61 y.o.   MRN: [id number] Patient ID: SALGUEROat Husted, male   DOB: [birth year], 61 y.o.   MRN: [id number] Patient ID: EDLERat Aliberti, male   DOB: [birth year], 61 y.o.   MRN: [id number] Patient ID: ALEGRIAat Tootle, male   DOB: [birth year], 61 y.o.   MRN: [id number]

## 2016-10-03 NOTE — Progress Notes (Signed)
D   Pt is cooperative and pleasant on approach   He interacts minimally but appropriately with others    He reports still having some depression because he is worried over his living situation and how he will get to doctor appts A    Verbal support given    Medications administered and effectiveness monitored   Q 15 min checks R    Pt safe at present time

## 2016-10-03 NOTE — [facility] Group Notes (Signed)
[facility] LCSW Group Therapy Note  Date/Time: 10/03/2016 1:30 PM  Type of Therapy/Topic:  Group Therapy:  Balance in Life  Participation Level:  Active, attentive  Description of Group:    This group will address the concept of balance and how it feels and looks when one is unbalanced. Patients will be encouraged to process areas in their lives that are out of balance, and identify reasons for remaining unbalanced. Facilitators will guide patients utilizing problem- solving interventions to address and correct the stressor making their life unbalanced. Understanding and applying boundaries will be explored and addressed for obtaining  and maintaining a balanced life. Patients will be encouraged to explore ways to assertively make their unbalanced needs known to significant others in their lives, using other group members and facilitator for support and feedback.  Therapeutic Goals: 1. Patient will identify two or more emotions or situations they have that consume much of in their lives. 2. Patient will identify signs/triggers that life has become out of balance:  3. Patient will identify two ways to set boundaries in order to achieve balance in their lives:  4. Patient will demonstrate ability to communicate their needs through discussion and/or role plays  Summary of Patient Progress: States "Ive listened more in the last 10 days than I ever have in my life."  Patient identified balance as "sanity, keeping balance between doing the right things and not going over the line."  Identifies that his life is becoming out of balance "when I drink twice what I usually do."   Identified stress at work as a significant issue for him, describes his life in a picture of small fish swimming near a large shark - "I feel like those fish, the shark will get me."  States that he appreciated "hearing everyone's story, we all have a shared experience and and trying to cope with it."    Therapeutic Modalities:    Cognitive Behavioral Therapy Solution-Focused Therapy Assertiveness Training  Santa GeneraAnne Rooney Swails, LCSW Lead Clinical Social Worker Phone:  [phone]

## 2016-10-03 NOTE — Progress Notes (Signed)
D: Pt was in the dayroom upon initial approach.  Pt presents with depressed affect and mood.  He complains of poor sleep for the "past 2 nights."  Pt reports he is discharging Wednesday afternoon and he feels safe to do so.  Pt states "I have a doctor's appointment and then I'm going to live with my daughter and son-in-law."  Pt reports he feels "good" about this aftercare plan.  Pt denies SI/HI, denies hallucinations, denies pain.  Pt has been visible in milieu interacting with peers and staff appropriately.  Pt attended evening group.    A: Introduced self to pt.  Actively listened to pt and offered support and encouragement. Medication administered per order.  PRN medication administered for sleep.  Q15 minute safety checks maintained.  R: Pt is safe on the unit.  Pt is compliant with medications.  Pt verbally contracts for safety.  Will continue to monitor and assess.

## 2016-10-03 NOTE — Progress Notes (Signed)
[facility] Group Notes:  (Nursing/MHT/Case Management/Adjunct)  Date:  10/03/2016  Time:  11:54 PM  Type of Therapy:  Psychoeducational Skills  Participation Level:  Active  Participation Quality:  Appropriate  Affect:  Appropriate  Cognitive:  Appropriate  Insight:  Good  Engagement in Group:  Engaged  Modes of Intervention:  Education  Summary of Progress/Problems: The patient stated that he was feeling better since he had a conversation with his daughter. He admits to feeling very anxious since he was unclear about where he would be going following discharge. The patient will be going to his daughter's house following discharge and that his daughter has already arranged a physical therapy appointment for him. He anticipates being discharged Wednesday afternoon.   BROCKMANGOODMAN, Inella Kuwahara S 10/03/2016, 11:54 PM

## 2016-10-03 NOTE — Progress Notes (Signed)
Patient ID: Andrew Maldonado, male   DOB: 06/22/1955, 61 y.o.   MRN: [id number]  D: Patient denies SI/HI and auditory and visual hallucinations. Patient has a depressed mood and affect. Dressing dry and intact. Patient discussed his sadness and depression over girlfriend leaving and loss of job. Patient concerned about heart rate. Pulse take manually. Pulse 98.  A: Patient given emotional support from RN. Patient given medications per MD orders. Patient encouraged to attend groups and unit activities. Patient encouraged to come to staff with any questions or concerns.  R: Patient remains cooperative and appropriate. Will continue to monitor patient for safety.

## 2016-10-03 NOTE — Plan of Care (Signed)
Problem: Coping: Goal: Ability to cope will improve Discussed issues that brought patient inwith patient. Discussed plans patient has for discharge and coping with loss of job and girlfriend.

## 2016-10-03 NOTE — Plan of Care (Signed)
Problem: Self-Concept: Goal: Ability to disclose and discuss suicidal ideas will improve Outcome: Progressing Patient denies suicidality at this time. Goal: Ability to verbalize positive feelings about self will improve Outcome: Progressing Patient able to verbalize some positives in his life.Discussed positive relationship with daughter. 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

## 2016-10-03 NOTE — Social Work (Signed)
CSW spoke w PFEIFLEicole Mullens (patients daughter) re discharge planning.  Wants patient to admit [facility] "as soon as possible."  At this point, states "the plan is for him to come home with us on Wednesday because I dont know of anywhere else he can go."  CSW reaffirmed plan w daughter - explained that patient must be medically cleared by surgeon prior to admission to residential substance abuse treatment facility.  Daughter voiced understanding that this process will need to be completed outpatient, CSW provided daughter w list of referrals for these facilities.  Daughter identified [facility] [place] and [facility] as local community resources for patients outpatient treatment.  CSW discussed these options w patient and addressed aftercare needs.  Plan for patient is to discharge in order to attend surgical follow up appointment and then live w daughter in [place] while recovering from wounds, will attend outpatient counseling based on comprehensive clinical assessment of outpatient agency.  Santa GeneraAnne Shermar Friedland, LCSW Lead Clinical Social Worker Phone:  [phone]

## 2016-10-03 NOTE — [facility] Group Notes (Signed)
[facility] LCSW Aftercare Discharge Planning Group Note   [facility]  Type of Group and Topic: Psychoeducational Group:  Discharge Planning  Participation Level:  Active  Description of Group  Discharge planning group reviews patient's anticipated discharge plans and assists patients to anticipate and address any barriers to wellness/recovery in the community.  Suicide prevention education is reviewed with patients in group.  Therapeutic Goals 1. Patients will state their anticipated discharge plan and mental health aftercare 2. Patients will identify potential barriers to wellness in the community setting 3. Patients will engage in problem solving, solution focused discussion of ways to anticipate and address barriers to wellness/recovery  Summary of Patient Progress Patient aware of plan to discharge to [place] and seek treatment there.  Also has follow up w surgeon arranged and plans to attend these appointments.  Has support of family.   Plan for Discharge/Comments:  Live w daughter, follow up outpatient.  Per daughter, patient needs to comply w all treatment recommended by clinical assessment and will need to refrain from smoking in her home.  Patient aware  Transportation Means: daughter  Supports: daughter  Therapeutic Modalities: Motivational Interviewing  Santa GeneraAnne Cunningham, [place]CS[place] Lead Clinical Social Worker Phone:  [phone]

## 2016-10-03 NOTE — Plan of Care (Signed)
Problem: Activity: Goal: Sleeping patterns will improve Outcome: Not Progressing Pt complains of poor sleep for the past 2 nights.

## 2016-10-04 MED ORDER — IBUPROFEN 600 MG PO TABS: 600.0000 mg | ORAL_TABLET | Freq: Four times a day (QID) | ORAL | Status: DC | PRN

## 2016-10-04 NOTE — Progress Notes (Signed)
Patient ID: Andrew Maldonado, male   DOB: [birth year], 61 y.o.   MRN: [id number]  D: Patient denies SI and HI. Has blunted affect today. Seems less talkative and more depressed today. Did not talk in AM group. Has had less interaction with peers. Patient to be interviewed by adult group home provider.  A: Patient given emotional support from RN. Patient given medications per MD orders. Patient encouraged to attend groups and unit activities. Patient encouraged to come to staff with any questions or concerns.  R: Patient remains cooperative and appropriate. Will continue to monitor patient for safety.

## 2016-10-04 NOTE — [facility] Group Notes (Signed)
[facility] Group Notes:  (Nursing/MHT/Case Management/Adjunct)  Date:  10/04/2016  Time:  11:29 AM  Type of Therapy:  Nurse Education  Participation Level:  Minimal  Participation Quality:  none  Affect:  Flat  Cognitive:  Appropriate  Insight:  Improving  Engagement in Group:  None  Modes of Intervention:  Discussion  Summary of Progress/Problems:  SWETZMaggio, Ogle Hoeffner J 10/04/2016, 11:29 AM

## 2016-10-04 NOTE — Social Work (Signed)
CSW received call from patient's daughter PATRASicole Mullens [phone]) requesting further clarification of patients discharge plan.  CSW  provided information of time/date of appointments for aftercare to daughter VM, unable to reach at either number.  Will target discharge for 11 AM tomorrow in order to make surgical aftercare appointment..  NP Money informed of plan.  Santa GeneraAnne Bobi Daudelin, LCSW Lead Clinical Social Worker Phone:  [phone]

## 2016-10-04 NOTE — Social Work (Signed)
CSW spoke w daughter LEATHERicole Mullens, understands that patient will discharge tomorrow 11 - 11:30 and daughter will transport to surgical aftercare appointment.  Wants to be part of discharge conversation w RN if possible.  Daughter is concerned that patient may need help w transportation, wants patient to apply for Medicaid.  Daughter would like to see a copy of patient's suicide safety plan.  Daughter encouraged to ask surgeon to give her the plan of care so that she can help patient transition into residential substance abuse treatment.  Daughter continues to ask questions about how to help patient get into continued residential substance abuse treatment.  Santa GeneraAnne Illianna Paschal, LCSW Lead Clinical Social Worker Phone:  [phone]

## 2016-10-04 NOTE — [facility] Group Notes (Signed)
[facility] Mental Health Association Group Therapy 10/04/2016 1:15pm  Type of Therapy: Mental Health Association Presentation  Participation Level: Active  Participation Quality: Attentive  Affect: Appropriate  Cognitive: Oriented  Insight: Developing/Improving  Engagement in Therapy: Engaged  Modes of Intervention: Discussion, Education and Socialization  Summary of Progress/Problems: Mental Health Association (MHA) Speaker came to talk about his personal journey with living with a mental health diagnosis.The pt processed ways by which to relate to the speaker. MHA speaker provided handouts and educational information pertaining to groups and services offered by the MHA. Pt was engaged in speaker's presentation and was receptive to resources provided.    Talissa Apple B. Analiz Tvedt, MSW, LCSWA 10/04/2016 2:44 PM   

## 2016-10-04 NOTE — Progress Notes (Signed)
[facility]  10/04/2016 2:37 PM STARat Alderfer  MRN:  [id number] Subjective:  Patient reports that he feels good today and will be discharged with his daughter tomorrow. They have already made arrangements to go to his appointment for his arms. She lives near [place] and she is trying to find a provider in that area that can see him regularly. He has no complaints today and agrees to continue his medications.  Objective: Patient has pleasant mood and an appropriate affect. He seems to have a god plan and some social support to get his plan into action as well as family support. He denies SI/HI/AVH currently.  Principal Problem: MDD (major depressive disorder) Diagnosis:   Patient Active Problem List   Diagnosis Date Noted  . ETOH abuse [F10.10] 09/26/2016  . MDD (major depressive disorder) [F32.9] 09/23/2016   Total Time spent with patient: 15 minutes  Past Psychiatric History: See H&P  Past Medical History: History reviewed. No pertinent past medical history.  Past Surgical History:  Procedure Laterality Date  . FLEXOR TENDON REPAIR Bilateral 09/22/2016   Procedure: FLEXOR TENDON REPAIR;  Surgeon: Betha LoaKuzma, Kevin, MD;  [facility];  Service: Orthopedics;  Laterality: Bilateral;  . I&D EXTREMITY Bilateral 09/22/2016   Procedure: IRRIGATION AND DEBRIDEMENT EXTREMITY;  Surgeon: Betha LoaKuzma, Kevin, MD;  Location: [facility] OR;  Service: Orthopedics;  Laterality: Bilateral;  . NERVE, TENDON AND ARTERY REPAIR N/A 09/22/2016   Procedure: BILATERAL ARM EXPLORATION POSSIBLE NERVE, TENDON AND ARTERY REPAIR AS NEEDED;  Surgeon: Betha LoaKuzma, Kevin, MD;  Location: [facility] OR;  Service: Orthopedics;  Laterality: N/A;  . REPAIR EXTENSOR TENDON Bilateral 09/22/2016   Procedure: REPAIR EXTENSOR TENDON;  Surgeon: Betha LoaKuzma, Kevin, MD;  Location: [facility] OR;  Service: Orthopedics;  Laterality: Bilateral;   Family History: History reviewed. No pertinent family history. Family Psychiatric  History: See H&P Social History:  History   Alcohol Use  . 2.4 oz/week  . 4 Cans of beer per week     History  Drug Use No    Social History   Social History  . Marital status: Divorced    Spouse name: N/A  . Number of children: N/A  . Years of education: N/A   Social History Main Topics  . Smoking status: Current Every Day Smoker  . Smokeless tobacco: Never Used  . Alcohol use 2.4 oz/week    4 Cans of beer per week  . Drug use: No  . Sexual activity: Not Currently   Other Topics Concern  . None   Social History Narrative  . None   Additional Social History:                         Sleep: Good  Appetite:  Good  Current Medications: Current Facility-Administered Medications  Medication Dose Route Frequency Provider Last Rate Last Dose  . acetaminophen (TYLENOL) tablet 650 mg  650 mg Oral Q6H PRN Okonkwo, Justina A, NP   650 mg at 09/24/16 2108  . alum & mag hydroxide-simeth (MAALOX/MYLANTA) 200-200-20 MG/5ML suspension 30 mL  30 mL Oral Q4H PRN Okonkwo, Justina A, NP      . hydrOXYzine (ATARAX/VISTARIL) tablet 25 mg  25 mg Oral Q6H PRN Izediuno, Delight OvensVincent A, MD   25 mg at 10/02/16 2225  . ibuprofen (ADVIL,MOTRIN) tablet 600 mg  600 mg Oral Q6H PRN Nelly RoutKumar, Archana, MD      . magnesium hydroxide (MILK OF MAGNESIA) suspension 30 mL  30 mL Oral Daily PRN  Ferne Reus A, NP   30 mL at 09/25/16 0831  . mirtazapine (REMERON) tablet 15 mg  15 mg Oral QHS Izediuno, Delight Ovens, MD   15 mg at 10/03/16 2101  . multivitamin with minerals tablet 1 tablet  1 tablet Oral Daily Nira Conn A, NP   1 tablet at 10/04/16 [id number]  . nicotine (NICODERM CQ - dosed in mg/24 hours) patch 21 mg  21 mg Transdermal Daily Stephon Weathers, Rockey Situ, MD   21 mg at 10/04/16 5284  . sertraline (ZOLOFT) tablet 100 mg  100 mg Oral Daily Izediuno, Delight Ovens, MD   100 mg at 10/04/16 0811  . thiamine (VITAMIN B-1) tablet 100 mg  100 mg Oral Daily Nira Conn A, NP   100 mg at 10/04/16 0811  . traZODone (DESYREL) tablet 50 mg  50 mg Oral QHS PRN  Beryle Lathe, Justina A, NP   50 mg at 10/03/16 2101    Lab Results: No results found for this or any previous visit (from the past 48 hour(s)).  Blood Alcohol level:  Lab Results  Component Value Date   ETH <5 09/22/2016    Metabolic Disorder Labs: No results found for: HGBA1C, MPG No results found for: PROLACTIN No results found for: CHOL, TRIG, HDL, CHOLHDL, VLDL, LDLCALC  Physical Findings: AIMS: Facial and Oral Movements Muscles of Facial Expression: None, normal Lips and Perioral Area: None, normal Jaw: None, normal Tongue: None, normal,Extremity Movements Upper (arms, wrists, hands, fingers): None, normal Lower (legs, knees, ankles, toes): None, normal, Trunk Movements Neck, shoulders, hips: None, normal, Overall Severity Severity of abnormal movements (highest score from questions above): None, normal Incapacitation due to abnormal movements: None, normal Patient's awareness of abnormal movements (rate only patient's report): No Awareness, Dental Status Current problems with teeth and/or dentures?: No Does patient usually wear dentures?: No  CIWA:  CIWA-Ar Total: 0 COWS:     Musculoskeletal: Strength & Muscle Tone: within normal limits Gait & Station: normal Patient leans: N/A  Psychiatric Specialty Exam: Physical Exam  Nursing note and vitals reviewed. Constitutional: He is oriented to person, place, and time. He appears well-developed and well-nourished.  Cardiovascular: Normal rate and regular rhythm.   Respiratory: Effort normal.  Musculoskeletal:  Bilateral upper extremities have splints and wrists are restricted ROM  Neurological: He is alert and oriented to person, place, and time.  Skin: Skin is warm.    Review of Systems  Constitutional: Negative.   HENT: Negative.   Eyes: Negative.   Respiratory: Negative.   Cardiovascular: Negative.   Genitourinary: Negative.   Musculoskeletal: Negative.   Skin: Negative.   Neurological: Negative.    Endo/Heme/Allergies: Negative.     Blood pressure 128/78, pulse 93, temperature 98.1 F (36.7 C), temperature source Oral, resp. rate 16, height 5\' 10"  (1.778 m), weight 70.3 kg (155 lb), SpO2 99 %.Body mass index is 22.24 kg/m.  General Appearance: Casual  Eye Contact:  Good  Speech:  Clear and Coherent and Normal Rate  Volume:  Normal  Mood:  Euthymic  Affect:  Appropriate  Thought Process:  Coherent and Descriptions of Associations: Intact  Orientation:  Full (Time, Place, and Person)  Thought Content:  WDL  Suicidal Thoughts:  No  Homicidal Thoughts:  No  Memory:  Immediate;   Good Recent;   Good  Judgement:  Good  Insight:  Good  Psychomotor Activity:  Normal  Concentration:  Concentration: Good and Attention Span: Good  Recall:  Good  Fund of Knowledge:  Good  Language:  Good  Akathisia:  No  Handed:  Right  AIMS (if indicated):     Assets:  Social Support  ADL's:  Intact  Cognition:  WNL  Sleep:  Number of Hours: 6.25     Treatment Plan Summary: Daily contact with patient to assess and evaluate symptoms and progress in treatment, Medication management and Plan is to:  -Continue Zoloft 100 mg PO Daily for mood stability -Continue Mirtazapine 15 mg PO QHS for mood stability -Continue Hydroxyzine and Trazodone PRN -Encourage group therapy participation .  Maryfrances Bunnell, FNP 10/04/2016, 2:37 PM   Agree with NP Progress Note

## 2016-10-04 NOTE — Progress Notes (Signed)
Adult Psychoeducational Group Note  Date:  10/04/2016 Time:  11:34 PM  Group Topic/Focus:  Wrap-Up Group:   The focus of this group is to help patients review their daily goal of treatment and discuss progress on daily workbooks.  Participation Level:  Active  Participation Quality:  Appropriate  Affect:  Appropriate  Cognitive:  Alert  Insight: Appropriate  Engagement in Group:  Engaged  Modes of Intervention:  Discussion  Additional Comments:  Pt stated that he had a good day. He had a good visit with his family. He is looking forward to discharge tomorrow. He feels that his family is supportive so far.   STRICKCOOKE, Jerol Rufener R 10/04/2016, 11:34 PM

## 2016-10-05 MED ORDER — SERTRALINE HCL 100 MG PO TABS: 100.0000 mg | ORAL_TABLET | Freq: Every day | ORAL | 0 refills | Status: AC

## 2016-10-05 MED ORDER — MIRTAZAPINE 15 MG PO TABS: 15.0000 mg | ORAL_TABLET | Freq: Every day | ORAL | 0 refills | Status: AC

## 2016-10-05 MED ORDER — SERTRALINE HCL 100 MG PO TABS: 100.0000 mg | ORAL_TABLET | Freq: Every day | ORAL | 0 refills | Status: DC

## 2016-10-05 NOTE — Progress Notes (Signed)
Recreation Therapy Notes  Date: 10/05/2016 Time: 9:30am Location: [address] Dayroom  Group Topic: Stress Management  Goal Area(s) Addresses:  Patient will verbalize importance of using healthy stress management.  Patient will identify positive emotions associated with healthy stress management.   Behavioral Response: Engaged  Intervention: Stress Management  Activity : Guided Energy Starter. Recreation Therapy Intern introduced the stress management technique of a guided energy starter. Recreation Therapy Intern read a script that allowed patients to work on stretching and relaxing some muscles to help them feel energized. Recreation Therapy Intern played calming music. Patients were to follow along as script was read to engage in the activity.  Education: Stress Management, Discharge Planning.   Education Outcome: Acknowledges edcuation  Clinical Observations/Feedback: Pt attended group.  Rachel Meyer, Recreation Therapy Intern   Donnetta Gillin, LRT/CTRS  

## 2016-10-05 NOTE — Tx Team (Signed)
Interdisciplinary Treatment and Diagnostic Plan Update  10/05/2016 Time of Session: 1000 AM Andrew Maldonado MRN: [id number]  Principal Diagnosis: MDD, recurrent, severe   Secondary Diagnoses: Principal Problem:   MDD (major depressive disorder) Active Problems:   ETOH abuse   Current Medications:  Current Facility-Administered Medications  Medication Dose Route Frequency Provider Last Rate Last Dose  . acetaminophen (TYLENOL) tablet 650 mg  650 mg Oral Q6H PRN Okonkwo, Justina A, NP   650 mg at 09/24/16 2108  . alum & mag hydroxide-simeth (MAALOX/MYLANTA) 200-200-20 MG/5ML suspension 30 mL  30 mL Oral Q4H PRN Okonkwo, Justina A, NP      . hydrOXYzine (ATARAX/VISTARIL) tablet 25 mg  25 mg Oral Q6H PRN Izediuno, Delight Ovens, MD   25 mg at 10/02/16 2225  . ibuprofen (ADVIL,MOTRIN) tablet 600 mg  600 mg Oral Q6H PRN Nelly Rout, MD      . magnesium hydroxide (MILK OF MAGNESIA) suspension 30 mL  30 mL Oral Daily PRN Okonkwo, Justina A, NP   30 mL at 09/25/16 0831  . mirtazapine (REMERON) tablet 15 mg  15 mg Oral QHS Izediuno, Delight Ovens, MD   15 mg at 10/04/16 2152  . multivitamin with minerals tablet 1 tablet  1 tablet Oral Daily Nira Conn A, NP   1 tablet at 10/05/16 [id number]  . nicotine (NICODERM CQ - dosed in mg/24 hours) patch 21 mg  21 mg Transdermal Daily Cobos, Rockey Situ, MD   21 mg at 10/05/16 6213  . sertraline (ZOLOFT) tablet 100 mg  100 mg Oral Daily Izediuno, Delight Ovens, MD   100 mg at 10/05/16 0812  . thiamine (VITAMIN B-1) tablet 100 mg  100 mg Oral Daily Nira Conn A, NP   100 mg at 10/05/16 0865  . traZODone (DESYREL) tablet 50 mg  50 mg Oral QHS PRN Beryle Lathe, Justina A, NP   50 mg at 10/04/16 2152   Current Outpatient Prescriptions  Medication Sig Dispense Refill  . mirtazapine (REMERON) 15 MG tablet Take 1 tablet (15 mg total) by mouth at bedtime. 30 tablet 0  . nicotine (NICODERM CQ - DOSED IN MG/24 HOURS) 21 mg/24hr patch Place 1 patch (21 mg total) onto the skin daily. 28  patch 0  . [START ON 10/06/2016] sertraline (ZOLOFT) 100 MG tablet Take 1 tablet (100 mg total) by mouth daily. 30 tablet 0  . traZODone (DESYREL) 50 MG tablet Take 1 tablet (50 mg total) by mouth at bedtime as needed for sleep. 30 tablet 0   PTA Medications: No prescriptions prior to admission.    Patient Stressors: Financial difficulties Substance abuse  Patient Strengths: Capable of independent living General fund of knowledge Physical Health  Treatment Modalities: Medication Management, Group therapy, Case management,  1 to 1 session with clinician, Psychoeducation, Recreational therapy.   Physician Treatment Plan for Primary Diagnosis: MDD, recurrent, severe  Long Term Goal(s): Improvement in symptoms so as ready for discharge Improvement in symptoms so as ready for discharge   Short Term Goals: Ability to identify changes in lifestyle to reduce recurrence of condition will improve Ability to verbalize feelings will improve Ability to disclose and discuss suicidal ideas Ability to demonstrate self-control will improve Ability to identify and develop effective coping behaviors will improve Ability to maintain clinical measurements within normal limits will improve Compliance with prescribed medications will improve Ability to identify triggers associated with substance abuse/mental health issues will improve  Medication Management: Evaluate patient's response, side effects, and tolerance of medication regimen.  Therapeutic Interventions: 1 to 1 sessions, Unit Group sessions and Medication administration.  Evaluation of Outcomes: Adequate for discharge   Physician Treatment Plan for Secondary Diagnosis: Principal Problem:   MDD (major depressive disorder) Active Problems:   ETOH abuse  Long Term Goal(s): Improvement in symptoms so as ready for discharge Improvement in symptoms so as ready for discharge   Short Term Goals: Ability to identify changes in lifestyle to  reduce recurrence of condition will improve Ability to verbalize feelings will improve Ability to disclose and discuss suicidal ideas Ability to demonstrate self-control will improve Ability to identify and develop effective coping behaviors will improve Ability to maintain clinical measurements within normal limits will improve Compliance with prescribed medications will improve Ability to identify triggers associated with substance abuse/mental health issues will improve     Medication Management: Evaluate patient's response, side effects, and tolerance of medication regimen.  Therapeutic Interventions: 1 to 1 sessions, Unit Group sessions and Medication administration.  Evaluation of Outcomes: Adequate for discharge    RN Treatment Plan for Primary Diagnosis: MDD, recurrent, severe  Long Term Goal(s): Knowledge of disease and therapeutic regimen to maintain health will improve  Short Term Goals: Ability to remain free from injury will improve, Ability to verbalize feelings will improve and Ability to disclose and discuss suicidal ideas  Medication Management: RN will administer medications as ordered by provider, will assess and evaluate patient's response and provide education to patient for prescribed medication. RN will report any adverse and/or side effects to prescribing provider.  Therapeutic Interventions: 1 on 1 counseling sessions, Psychoeducation, Medication administration, Evaluate responses to treatment, Monitor vital signs and CBGs as ordered, Perform/monitor CIWA, COWS, AIMS and Fall Risk screenings as ordered, Perform wound care treatments as ordered.  Evaluation of Outcomes: Adequate for discharge    LCSW Treatment Plan for Primary Diagnosis: MDD, recurrent, severe  Long Term Goal(s): Safe transition to appropriate next level of care at discharge, Engage patient in therapeutic group addressing interpersonal concerns.  Short Term Goals: Engage patient in aftercare  planning with referrals and resources, Facilitate patient progression through stages of change regarding substance use diagnoses and concerns and Identify triggers associated with mental health/substance abuse issues  Therapeutic Interventions: Assess for all discharge needs, 1 to 1 time with Social worker, Explore available resources and support systems, Assess for adequacy in community support network, Educate family and significant other(s) on suicide prevention, Complete Psychosocial Assessment, Interpersonal group therapy.  Evaluation of Outcomes: Adequate for discharge    Progress in Treatment: Attending groups: Yes. Participating in groups: Yes. Taking medication as prescribed: Yes. Toleration medication: Yes. Family/Significant other contact made: SPE completed with pt; patients daughter contacted  Patient understands diagnosis: Yes. Discussing patient identified problems/goals with staff: Yes. Medical problems stabilized or resolved: Needs wound care and surgical follow up Denies suicidal/homicidal ideation: Yes. Issues/concerns per patient self-inventory: No. Other: n/a   New problem(s) identified: Patient needs surgical follow up, cannot be admitted to [facility] w current medical issues, needs outpatient stability for continued recovery, referral in place for [facility] and possible short term family care home placement  New Short Term/Long Term Goal(s): "To get well and to not drink anymore" Pt states that this hospital stay has helped him get on the right track towards these goals.  Discharge Plan or Barriers: [facility] screening for admission scheduled for Thursday, 09/29/16; [facility] for outpatient mental health services. [facility] pamphlet for additional community support/resources.  09/30/16:  Patient declined at [facility] due to need for surgical  follow up and wound care; CSW working w patient to identify additional psychosocial support issues to help resolve homelessness issues,  [facility] in assisted living or family care home; MD contacting daugther to assess family support; aftercare w surgeon arranged, patient [facility]; MD continues to adjust medications and nursing continues to work on coping skills/emotion regulation 10/05/16: Pt will return   Reason for Continuation of Hospitalization: medication management; risk for suicide due to impulsiveness, hopelessness, lack of social support, poor coping strategies  Estimated Length of Stay: 5 days; possible discharge 10/04/16  Attendees: Patient: HOLLINGSHEDat Gabrielson 10/05/2016 2:33 PM  Physician: Dr. Jama Flavorsobos MD 10/05/2016 2:33 PM  Nursing: Marylu LundJanet, RN 10/05/2016 2:33 PM  RN Care Manager:  10/05/2016 2:33 PM  Social Worker: Donnelly StagerLynn Mykia Holton, MSW, LCSWA 10/05/2016 2:33 PM  Recreational Therapist: x 10/05/2016 2:33 PM  Other: T 10/05/2016 2:33 PM  Other:  10/05/2016 2:33 PM  Other: 10/05/2016 2:33 PM    Scribe for Treatment Team: Jonathon JordanLynn B Zalayah Pizzuto, LCSWA 10/05/2016 2:33 PM

## 2016-10-05 NOTE — Progress Notes (Signed)
Patient ID: [name], male   DOB: January 29, 1956, 61 y.o.   MRN: [id number]  Patient discharged per MD orders. Patient and daughter given education regarding follow-up appointments and medications. Patient and daughter deny any questions or concerns about these instructions. Patient was escorted to locker and given belongings before discharge to hospital lobby. Patient currently denies SI/HI and auditory and visual hallucinations on discharge.

## 2016-10-05 NOTE — Progress Notes (Signed)
  [facility] :  Will you be returning to the same living situation after discharge:  No. Pt will be going to live with his daughter. At discharge, do you have transportation home?: Yes,  pt's daughter will transport. Do you have the ability to pay for your medications: Yes,  prescriptions and samples provided.  Release of information consent forms completed and in the chart;  Patient's signature needed at discharge.  Patient to Follow up at: Follow-up Information    Betha LoaKuzma, Kevin, MD Follow up on 10/05/2016.   Specialty:  Orthopedic Surgery Why:  Surgical outpatient follow up appointment on August 8th at 1:45.  Bring hospital discharge paperwork to this appointment.   Contact information: [address] [place] [address] [phone]        [facility] Follow up on 10/07/2016.   Why:  Hospital discharge follow up appointment Friday Aug 10 at 10 AM.  Please call to cancel/reschedule if needed.  Bring photo ID, medication list, proof of income to this appointment. Contact information: [address],  [place], [place] [address] Phone: [phone] Fax:  [phone]       [facility]. Go in 5 day(s).   Why:  Walk in for assessment therapy and substance abuse treatment services on  Mon, Tues, Weds from 8 AM - 2 PM.  Bring photo ID and hospital discharge paperwork Contact information: [address] [place]  [address] Phone:  [phone] Fax:  [phone]          Next level of care provider has access to [facility] Link:no  Safety Planning and Suicide Prevention discussed: Yes,  with pt and with pt's daughter.  Have you used any form of tobacco in the last 30 days? (Cigarettes, Smokeless Tobacco, Cigars, and/or Pipes): Yes  Has patient been referred to the Quitline?: Patient refused referral  Patient has been referred for addiction treatment: Yes  Jonathon JordanLynn B Dicy Smigel, MSW, LCSWA 10/05/2016, 9:13 AM

## 2016-10-05 NOTE — [facility] Suicide Risk Assessment (Signed)
[facility] Discharge Suicide Risk Assessment   Principal Problem: MDD (major depressive disorder) Discharge Diagnoses:  Patient Active Problem List   Diagnosis Date Noted  . ETOH abuse [F10.10] 09/26/2016  . MDD (major depressive disorder) [F32.9] 09/23/2016    Total Time spent with patient: 30 minutes  Musculoskeletal: Strength & Muscle Tone: within normal limits Gait & Station: normal Patient leans: N/A  Psychiatric Specialty Exam: ROS denies headache, no chest pain, no shortness of breath, no forearm pain , can move fingers distally without difficulty.   Blood pressure 112/75, pulse (!) 118, temperature 97.6 F (36.4 C), temperature source Oral, resp. rate 16, height 5\' 10"  (1.778 m), weight 70.3 kg (155 lb), SpO2 99 %.Body mass index is 22.24 kg/m.  General Appearance: improved grooming   Eye Contact::  Good  Speech:  Normal Rate409  Volume:  Normal  Mood:  denies feeling depressed, states " my mood is pretty good today"  Affect:  Appropriate and reactive, smiles at times appropriately   Thought Process:  Linear and Descriptions of Associations: Intact  Orientation:  Other:  fully alert and attentive   Thought Content:  denies hallucinations, no delusions , not internally preoccupied   Suicidal Thoughts:  No denies any suicidal ideations,denies any self injurious ideations, and identifies his family as a protective factor against hurting self - states " I do not want my family to suffer - I am not going to put them through this again"   Homicidal Thoughts:  No  Memory:  recent and remote grossly intact  Judgement:  Other:  improving   Insight:  improving   Psychomotor Activity:  Normal  Concentration:  Good  Recall:  Good  Fund of Knowledge:Good  Language: Good  Akathisia:  Negative  Handed:  Right  AIMS (if indicated):     Assets:  Communication Skills Desire for Improvement Resilience  Sleep:  Number of Hours: 6  Cognition: WNL  ADL's:  Intact   Mental Status Per  Nursing Assessment::   On Admission:  NA  Demographic Factors:  61 year old male, divorced, had been living in a hotel, will be living with his daughter until he goes to [facility]   Loss Factors: Unemployment, thinks his age is a factor that he has difficulty getting a job   Historical Factors: History of alcohol use disorder , no prior suicidal or self cutting episodes, no prior psychiatric admissions   Risk Reduction Factors:   Sense of responsibility to family, Living with another person, especially a relative, Positive social support and Positive coping skills or problem solving skills- states daughter has been supportive   Continued Clinical Symptoms:  At this time patient is alert and attentive, well related, pleasant, mood is improving , denies feeling depressed at this time, affect is appropriate, reactive, no thought disorder, no suicidal or self injurious ideations, denies any homicidal or violent ideations, no psychotic symptoms.  States " I am in a good place right now". States he feels his family has been very supportive, is looking forward to living with daughter and grandchildren, and states " I have started reading the bible again , and I am thinking of starting to go to [employer] ". Denies medication side effects.   Cognitive Features That Contribute To Risk:  No gross cognitive deficits noted upon discharge. Is alert , attentive, and oriented x 3   Suicide Risk:  Mild:  Suicidal ideation of limited frequency, intensity, duration, and specificity.  There are no identifiable plans, no  associated intent, mild dysphoria and related symptoms, good self-control (both objective and subjective assessment), few other risk factors, and identifiable protective factors, including available and accessible social support.  Follow-up Information    Betha LoaKuzma, Kevin, MD Follow up on 10/05/2016.   Specialty:  Orthopedic Surgery Why:  Surgical outpatient follow up appointment on August 8th at  1:45.  Bring hospital discharge paperwork to this appointment.   Contact information: [address] [place] [address] [phone]        [facility] Follow up on 10/07/2016.   Why:  Hospital discharge follow up appointment Friday Aug 10 at 10 AM.  Please call to cancel/reschedule if needed.  Bring photo ID, medication list, proof of income to this appointment. Contact information: [address],  [place], [place] [address] Phone: [phone] Fax:  [phone]       [facility]. Go in 5 day(s).   Why:  Walk in for assessment therapy and substance abuse treatment services on  Mon, Tues, Weds from 8 AM - 2 PM.  Bring photo ID and hospital discharge paperwork Contact information: [address] [place]  [address] Phone:  [phone] Fax:  [phone]          Plan Of Care/Follow-up recommendations:  Activity:  as tolerated  Diet:  Regular Tests:  NA Other:  See below Patient expresses readiness for discharge. He is leaving in good spirits .  States his daughter is coming to pick him up later today. Follow up as above . Craige CottaFernando A Cobos, MD 10/05/2016, 10:11 AM

## 2016-10-05 NOTE — Progress Notes (Signed)
D: Pt was in the dayroom upon initial approach.  Pt presents with appriopriate affect and mood.  He smiles upon approach.  Describes his day as "good" and reports he is discharging tomorrow.  Pt reports he feels safe to discharge.  Reports he had a good visit with his sister and brother-in-law.  Pt denies SI/HI, denies hallucinations, denies pain.  Pt has been visible in milieu interacting with peers and staff appropriately.  Pt attended evening group.    A: Actively listened to pt and offered support and encouragement. Medication administered per order.  PRN medication administered for sleep.  Q15 minute safety checks maintained.  R: Pt is safe on the unit.  Pt is compliant with medications.  Pt verbally contracts for safety.  Will continue to monitor and assess.
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
